# Patient Record
Sex: Female | Born: 1940 | Race: White | Hispanic: No | Smoking: Current every day smoker
Health system: Southern US, Community
[De-identification: ages and names within clinical notes are randomized; demographics above are authoritative.]

## PROBLEM LIST (undated history)

## (undated) DIAGNOSIS — J449 Chronic obstructive pulmonary disease, unspecified: Secondary | ICD-10-CM

## (undated) DIAGNOSIS — C801 Malignant (primary) neoplasm, unspecified: Secondary | ICD-10-CM

## (undated) DIAGNOSIS — J45909 Unspecified asthma, uncomplicated: Secondary | ICD-10-CM

## (undated) HISTORY — DX: Malignant (primary) neoplasm, unspecified: C80.1

## (undated) HISTORY — DX: Unspecified asthma, uncomplicated: J45.909

## (undated) HISTORY — DX: Chronic obstructive pulmonary disease, unspecified: J44.9

---

## 2006-04-21 HISTORY — PX: COLON SURGERY: SHX602

## 2006-09-18 ENCOUNTER — Ambulatory Visit: Payer: Self-pay | Admitting: Internal Medicine

## 2006-09-19 ENCOUNTER — Other Ambulatory Visit: Payer: Self-pay

## 2006-09-19 ENCOUNTER — Inpatient Hospital Stay: Payer: Self-pay | Admitting: Vascular Surgery

## 2006-09-20 ENCOUNTER — Ambulatory Visit: Payer: Self-pay | Admitting: Internal Medicine

## 2006-10-20 ENCOUNTER — Ambulatory Visit: Payer: Self-pay | Admitting: Internal Medicine

## 2006-10-28 ENCOUNTER — Ambulatory Visit: Payer: Self-pay | Admitting: Internal Medicine

## 2006-11-04 ENCOUNTER — Ambulatory Visit: Payer: Self-pay | Admitting: Vascular Surgery

## 2006-11-20 ENCOUNTER — Ambulatory Visit: Payer: Self-pay | Admitting: Internal Medicine

## 2006-12-21 ENCOUNTER — Ambulatory Visit: Payer: Self-pay | Admitting: Internal Medicine

## 2007-01-20 ENCOUNTER — Ambulatory Visit: Payer: Self-pay | Admitting: Internal Medicine

## 2007-02-20 ENCOUNTER — Ambulatory Visit: Payer: Self-pay | Admitting: Internal Medicine

## 2007-03-22 ENCOUNTER — Ambulatory Visit: Payer: Self-pay | Admitting: Internal Medicine

## 2007-04-22 ENCOUNTER — Ambulatory Visit: Payer: Self-pay | Admitting: Internal Medicine

## 2007-05-23 ENCOUNTER — Ambulatory Visit: Payer: Self-pay | Admitting: Internal Medicine

## 2007-06-20 ENCOUNTER — Ambulatory Visit: Payer: Self-pay | Admitting: Internal Medicine

## 2007-07-21 ENCOUNTER — Ambulatory Visit: Payer: Self-pay | Admitting: Internal Medicine

## 2007-08-20 ENCOUNTER — Ambulatory Visit: Payer: Self-pay | Admitting: Internal Medicine

## 2007-08-30 ENCOUNTER — Ambulatory Visit: Payer: Self-pay | Admitting: Vascular Surgery

## 2007-09-20 ENCOUNTER — Ambulatory Visit: Payer: Self-pay | Admitting: Internal Medicine

## 2007-10-20 ENCOUNTER — Ambulatory Visit: Payer: Self-pay | Admitting: Internal Medicine

## 2007-11-20 ENCOUNTER — Ambulatory Visit: Payer: Self-pay | Admitting: Internal Medicine

## 2007-12-21 ENCOUNTER — Ambulatory Visit: Payer: Self-pay | Admitting: Internal Medicine

## 2010-01-19 ENCOUNTER — Ambulatory Visit: Payer: Self-pay | Admitting: Internal Medicine

## 2010-01-29 ENCOUNTER — Ambulatory Visit: Payer: Self-pay | Admitting: Internal Medicine

## 2010-02-07 ENCOUNTER — Ambulatory Visit: Payer: Self-pay | Admitting: Internal Medicine

## 2010-02-19 ENCOUNTER — Ambulatory Visit: Payer: Self-pay | Admitting: Internal Medicine

## 2012-04-20 IMAGING — CT CT CHEST-ABD-PELV W/ CM
1 of 2 series · 13 of 32 positions shown, 18 images · non-contrast
Comparison: none

REASON FOR EXAM: Restaging Colorectal CA
COMMENTS:

[Series 2: ch-ab-pel w 5.0 i40f · axial · 0.74mm/px · z∈[-1026,-486]mm · 13 of 120 slices shown, 18 images]
[im 6/120  soft-tissue]
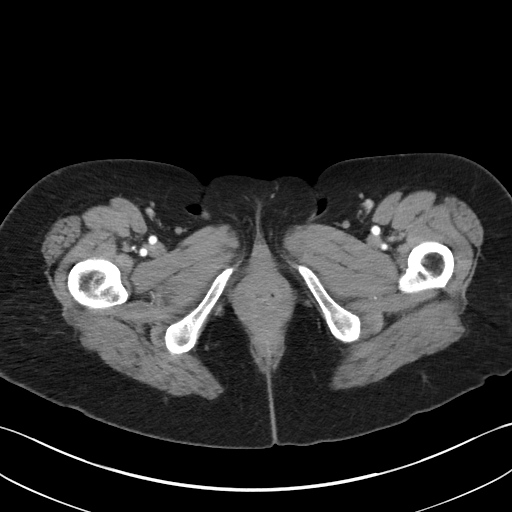
[im 6/120  bone]
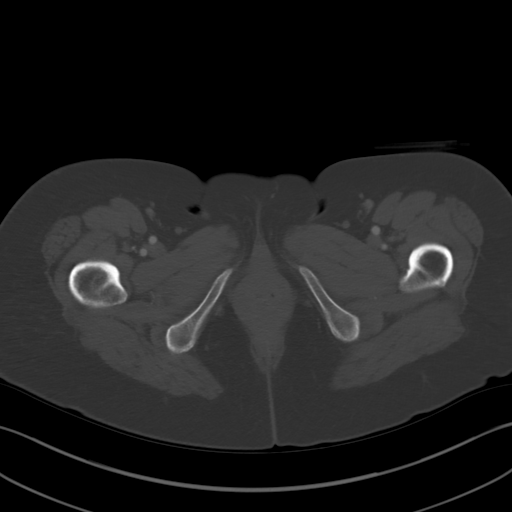
[im 18/120  soft-tissue]
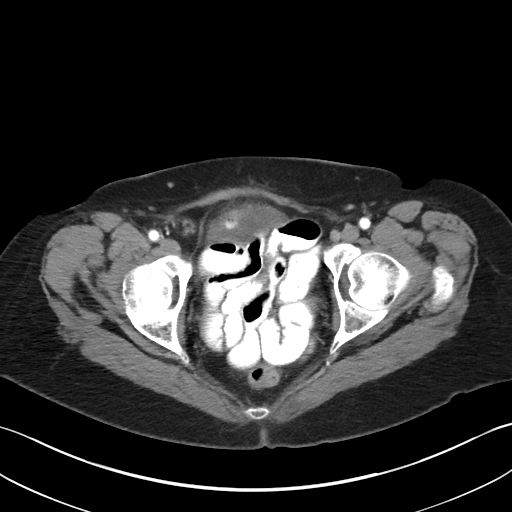
[im 24/120  soft-tissue]
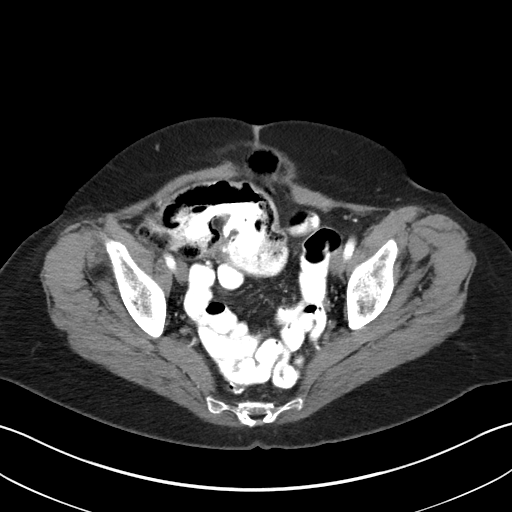
[im 36/120  soft-tissue]
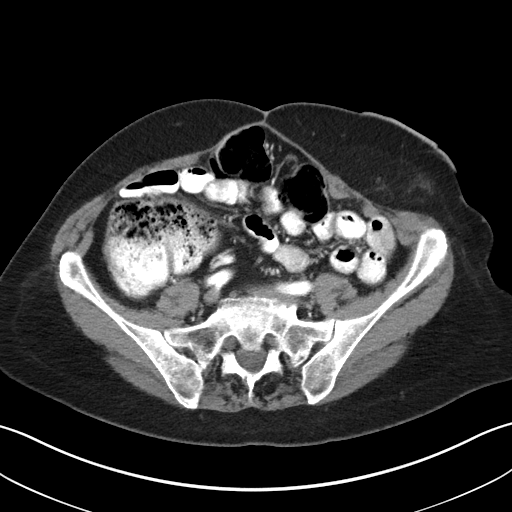
[im 48/120  soft-tissue]
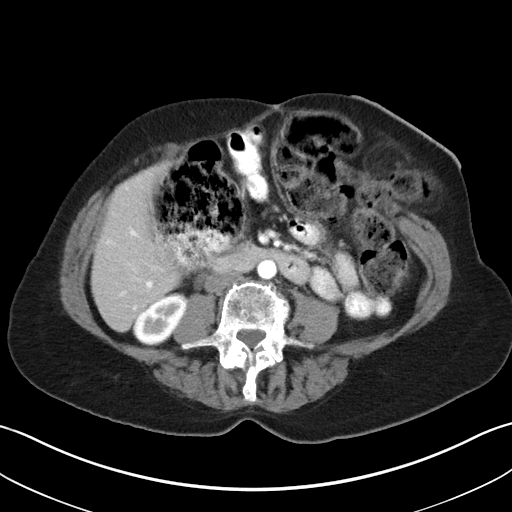
[im 54/120  soft-tissue]
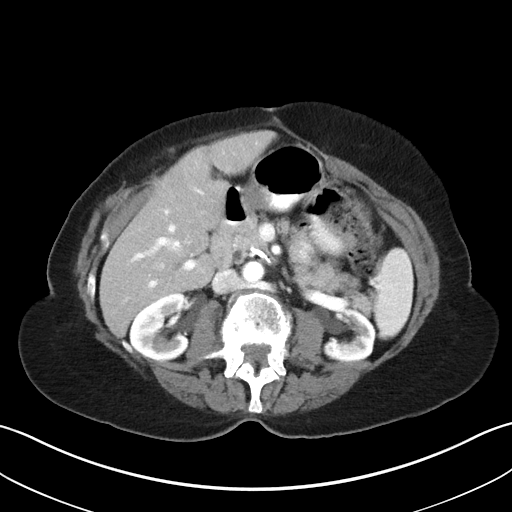
[im 66/120  soft-tissue]
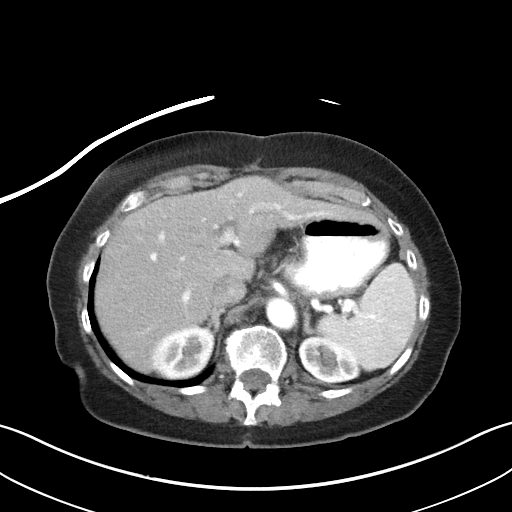
[im 72/120  soft-tissue]
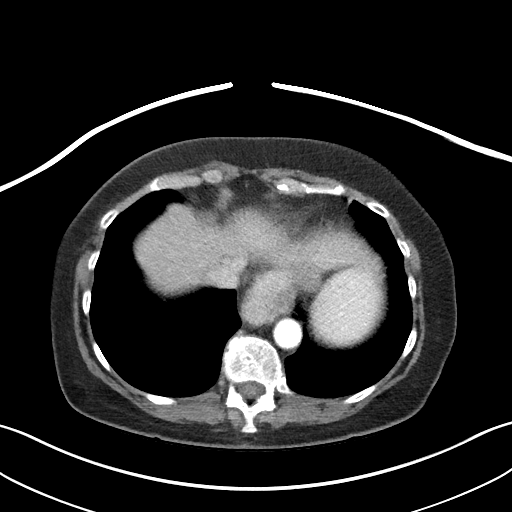
[im 84/120  soft-tissue]
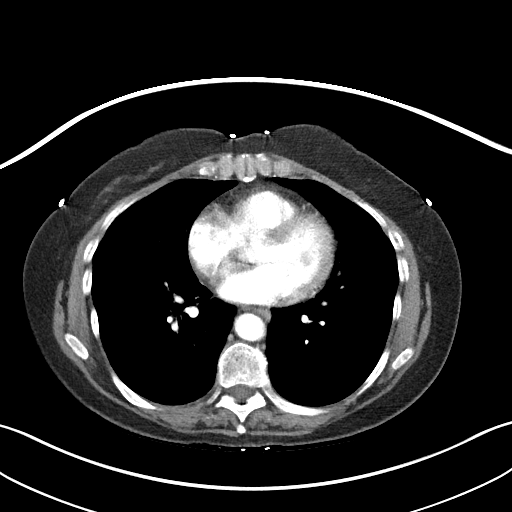
[im 84/120  bone]
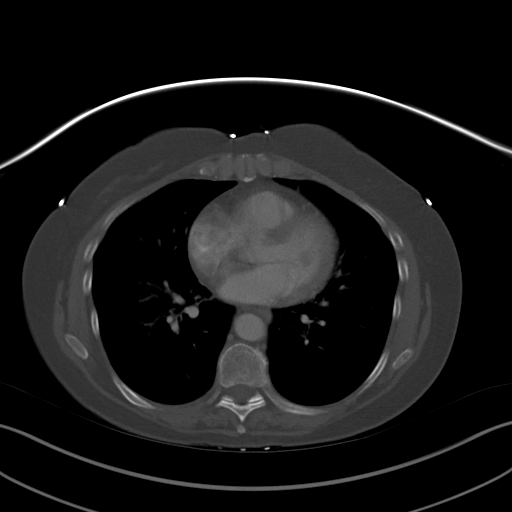
[im 96/120  soft-tissue]
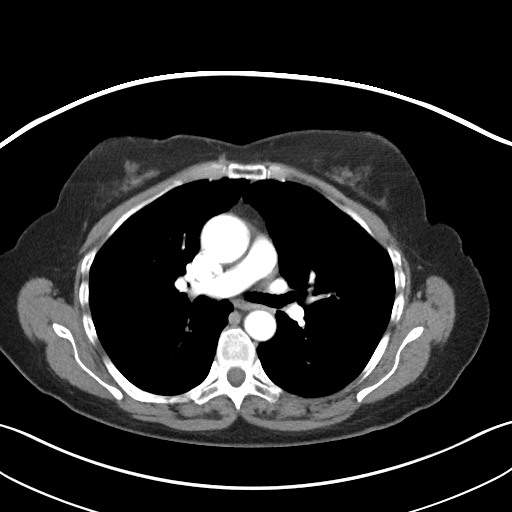
[im 96/120  lung]
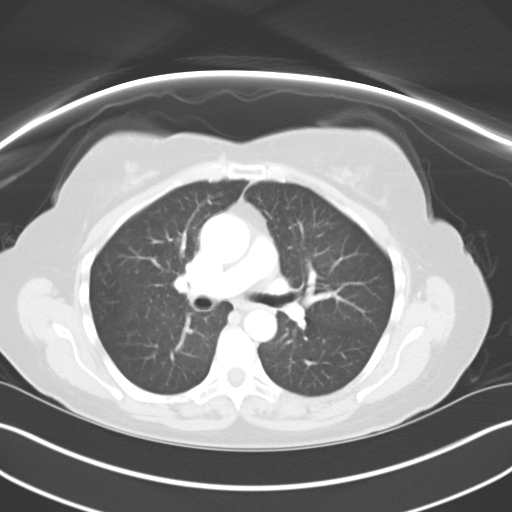
[im 102/120  soft-tissue]
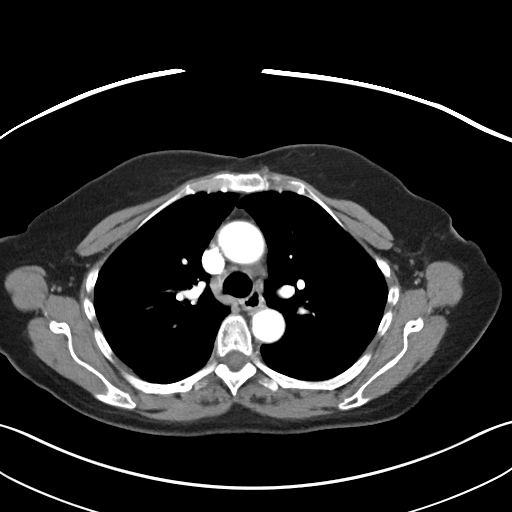
[im 102/120  lung]
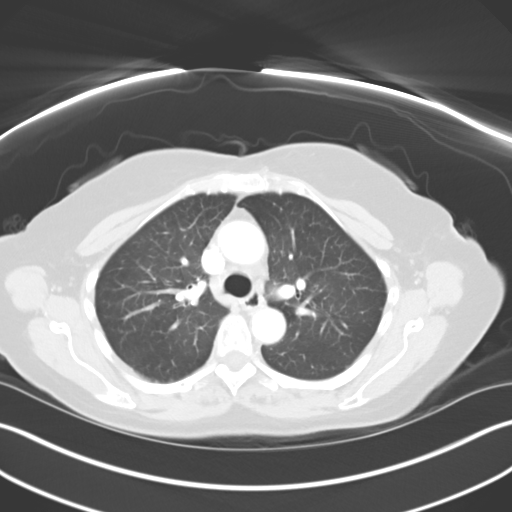
[im 108/120  lung]
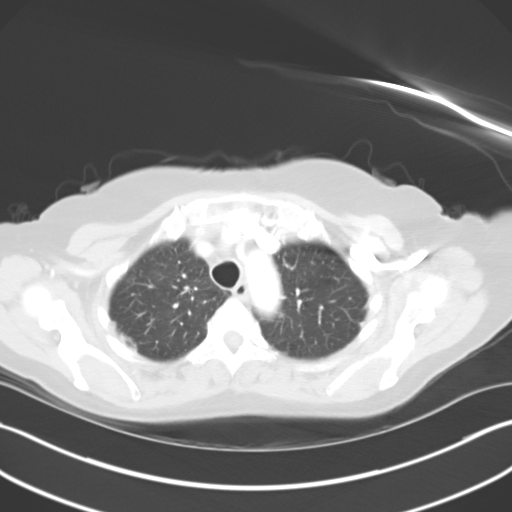
[im 114/120  soft-tissue]
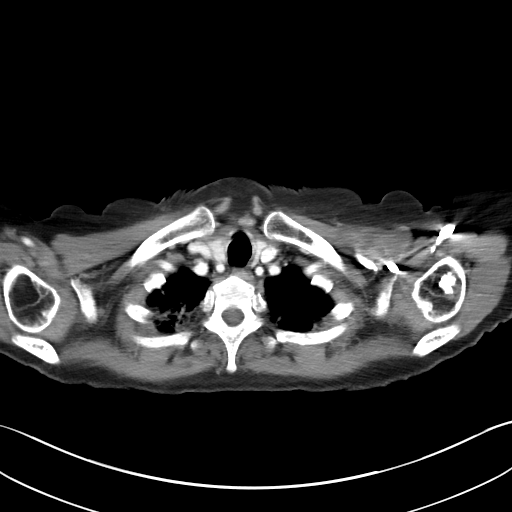
[im 114/120  lung]
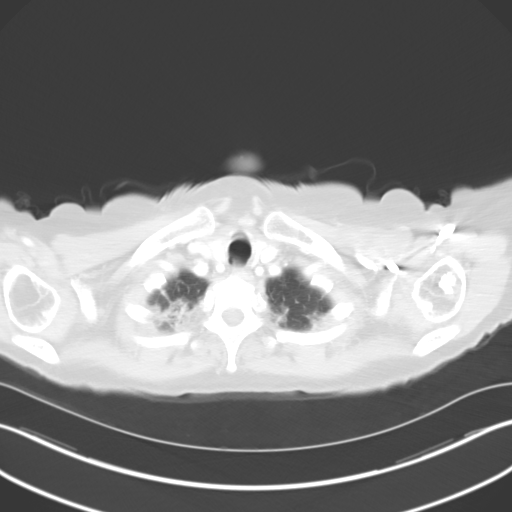

[13 of 32 positions shown; findings below may reference images not displayed]

PROCEDURE:     KCT - KCT CHEST ABDOMEN AND PELVIS W  - February 11, 2010  [DATE]

RESULT:     CT of the chest, abdomen and pelvis is performed utilizing 85 ml
of Rsovue-6F5 iodinated intravenous contrast along with oral contrast.
Images are reconstructed in the axial plane at 5 mm slice thickness and
compared to the previous study dated 08/24/2007.

There is a history of partial colectomy with an ostomy in the left mid
abdominal region. There is reported history of appendectomy and
cholecystectomy as well as hysterectomy. There is a paraumbilical ventral
hernia containing loops of small bowel without wall thickening or abnormal
bowel distention demonstrated between images #77 and #84. A second area of
bowel herniation is seen to the left of midline again containing no evidence
of abnormality such as abnormal distention or wall thickening demonstrated
between images #70 and #80. This appears to be transverse colon in this
hernia. The hernias appear to be new compared to the previous study from
4445. The rectum remains in place. There is a moderately large amount of
fecal material within the cecum and hepatic flexure regions. There is no
definite bowel wall thickening or evidence of obstruction. No hepatic masses
are evident. The spleen is unremarkable. The left kidney is slightly smaller
than the right. There is a moderate sized hiatal hernia. The adrenal glands
appear to be unremarkable. The aorta is normal in caliber. The urinary
bladder is within normal limits. There is no adnexal mass or pelvic mass
appreciated. No retroperitoneal or mesenteric adenopathy is evident. The
kidneys demonstrate no obstruction, cyst or solid mass.

Within the chest there is no mediastinal or hilar mass or adenopathy. There
is no supraclavicular or axillary mass or adenopathy. The included thyroid
lobes appear to enhance homogeneously and show no evidence of enlargement.
The thoracic aorta is normal in caliber. The heart is normal in size. There
is no pleural or pericardial effusion. There does appear to be apical
fibrosis present bilaterally. There is no evidence of a pulmonary
parenchymal mass, pleural effusion or pericardial effusion. Fibrotic changes
in the lungs appear to be stable.
IMPRESSION: 1. No evidence of residual or recurrent malignancy.
2. Colostomy in the left midabdomen.
3. Paraumbilical hernias to the right and left of midline as described.
4. Apical fibrosis in both lungs.
5. Not mentioned above is degenerative disc narrowing especially at L5-S1
and L4-L5 and also at L2-L3 where there is degenerative endplate sclerosis
inferiorly in L2 with some hypertrophic spurring. Multilevel facet
hypertrophy is present in the lumbar region.

## 2014-06-29 ENCOUNTER — Ambulatory Visit: Payer: Self-pay | Admitting: Internal Medicine

## 2015-05-29 ENCOUNTER — Other Ambulatory Visit: Payer: Self-pay | Admitting: *Deleted

## 2015-05-29 DIAGNOSIS — C189 Malignant neoplasm of colon, unspecified: Secondary | ICD-10-CM

## 2015-05-30 ENCOUNTER — Telehealth: Payer: Self-pay | Admitting: *Deleted

## 2015-05-30 NOTE — Telephone Encounter (Signed)
Per scheduling, pt refused appointment to see Dr. Oliva Bustard.

## 2017-06-25 ENCOUNTER — Other Ambulatory Visit: Payer: Self-pay | Admitting: Internal Medicine

## 2017-06-25 ENCOUNTER — Ambulatory Visit
Admission: RE | Admit: 2017-06-25 | Discharge: 2017-06-25 | Disposition: A | Discharge status: Home / Self Care | Payer: Medicare Other | Source: Ambulatory Visit | Attending: Internal Medicine | Admitting: Internal Medicine

## 2017-06-25 DIAGNOSIS — R059 Cough, unspecified: Secondary | ICD-10-CM

## 2017-06-25 DIAGNOSIS — J449 Chronic obstructive pulmonary disease, unspecified: Secondary | ICD-10-CM | POA: Diagnosis present

## 2017-06-25 DIAGNOSIS — R05 Cough: Secondary | ICD-10-CM

## 2019-09-02 IMAGING — DX DG CHEST 2V
2 series · 2 of 2 positions shown · non-contrast
Comparison: 06/29/2014

CLINICAL DATA: Cough

EXAM:
CHEST - 2 VIEW

[chest pa]
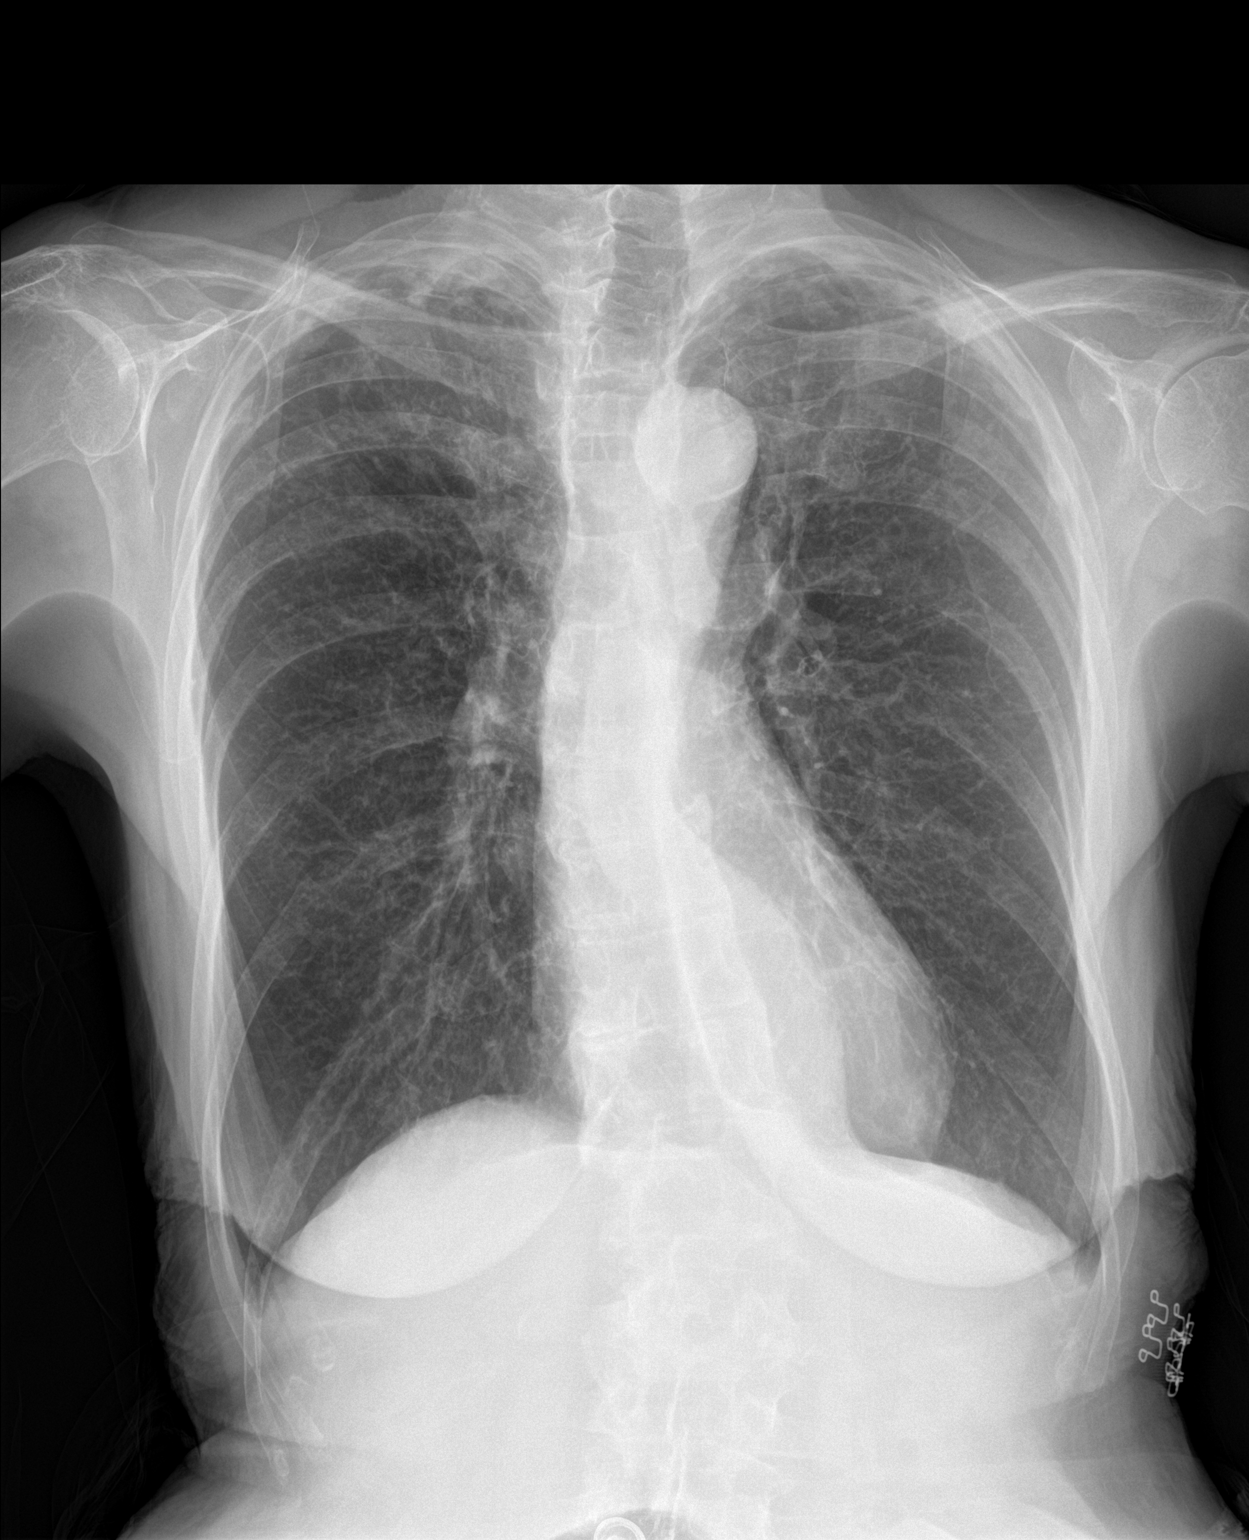

[chest lat]
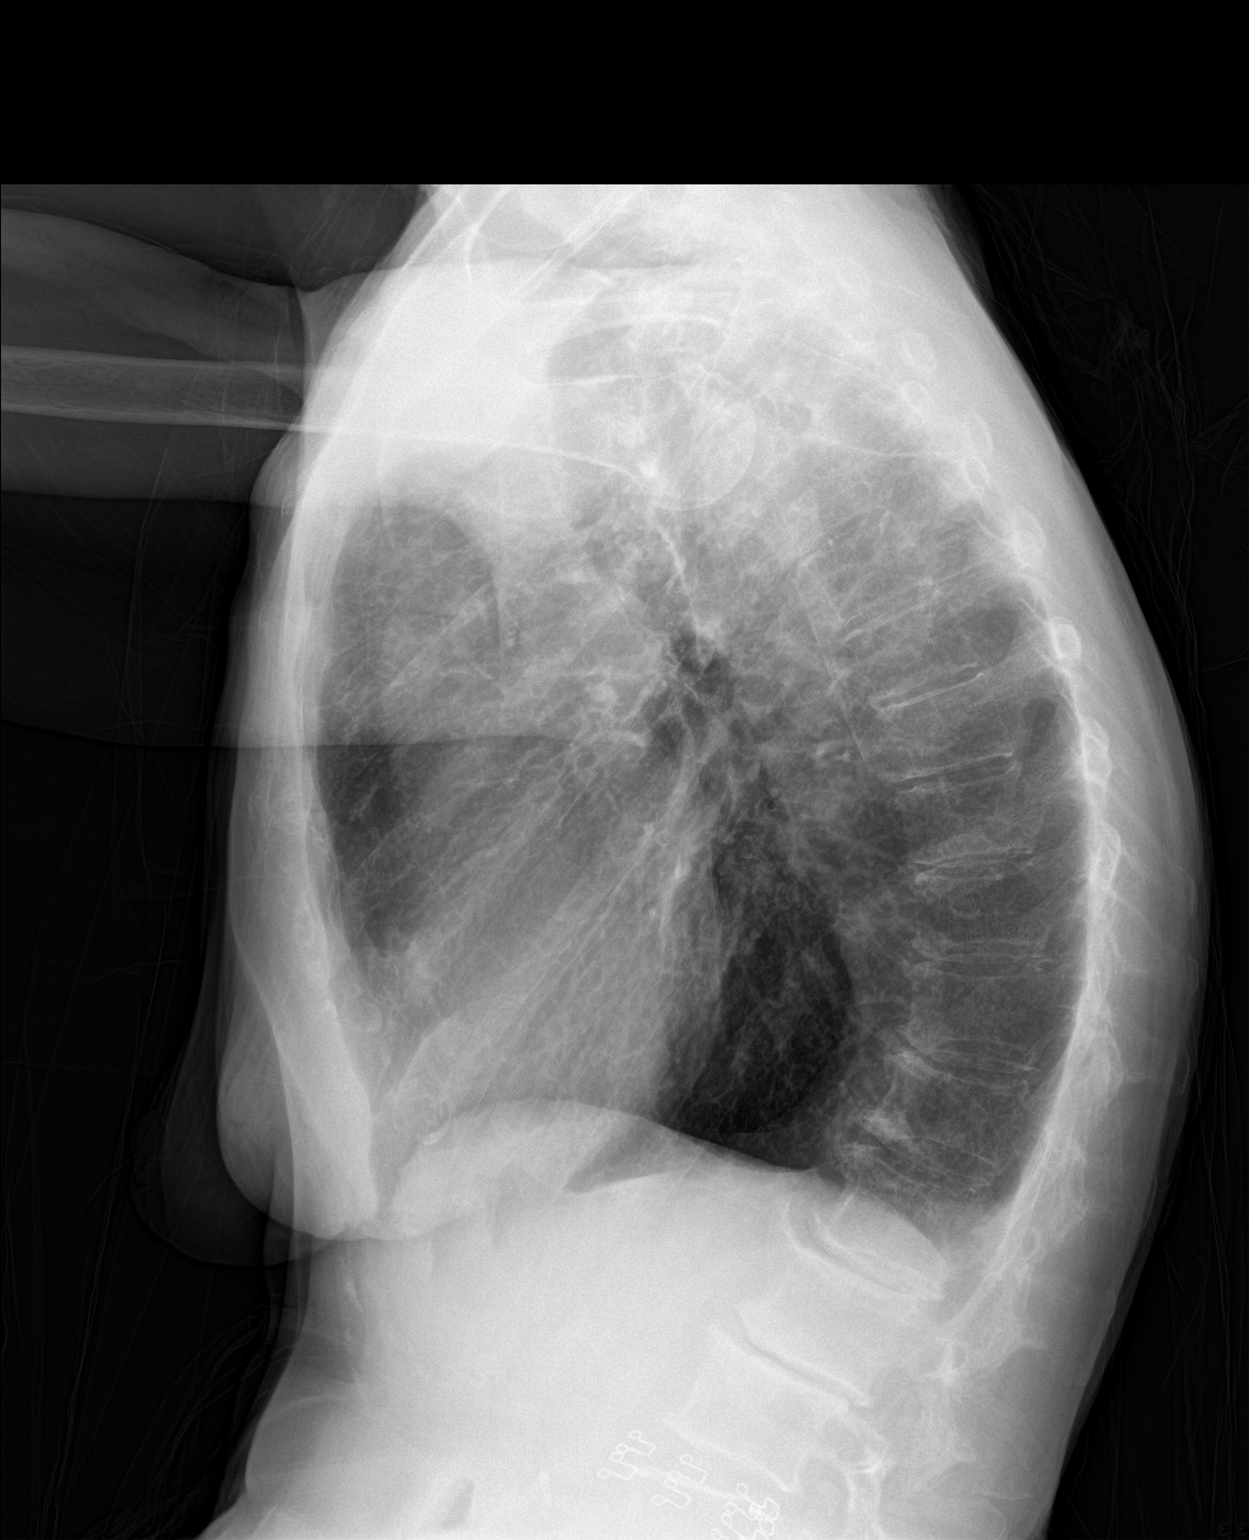

[2 of 2 positions shown; findings below may reference images not displayed]

FINDINGS: Cardiac shadow is within normal limits. Aortic calcifications are
again seen. Biapical pleuroparenchymal scarring is again seen and
stable. No focal infiltrate or sizable effusion is seen.
Hyperinflation is again noted consistent with COPD. No bony
abnormality is noted.
IMPRESSION: COPD and chronic changes without acute abnormality.

## 2022-09-26 ENCOUNTER — Encounter: Payer: Self-pay | Admitting: Physician Assistant

## 2022-09-26 ENCOUNTER — Ambulatory Visit (INDEPENDENT_AMBULATORY_CARE_PROVIDER_SITE_OTHER): Payer: 59 | Admitting: Physician Assistant

## 2022-09-26 VITALS — BP 122/82 | HR 82 | Temp 98.2°F | Ht 61.0 in | Wt 112.0 lb

## 2022-09-26 DIAGNOSIS — I700 Atherosclerosis of aorta: Secondary | ICD-10-CM | POA: Diagnosis not present

## 2022-09-26 DIAGNOSIS — Z85038 Personal history of other malignant neoplasm of large intestine: Secondary | ICD-10-CM

## 2022-09-26 DIAGNOSIS — J449 Chronic obstructive pulmonary disease, unspecified: Secondary | ICD-10-CM | POA: Diagnosis not present

## 2022-09-26 DIAGNOSIS — L821 Other seborrheic keratosis: Secondary | ICD-10-CM | POA: Diagnosis not present

## 2022-09-26 DIAGNOSIS — Z933 Colostomy status: Secondary | ICD-10-CM

## 2022-09-26 DIAGNOSIS — F17200 Nicotine dependence, unspecified, uncomplicated: Secondary | ICD-10-CM | POA: Diagnosis not present

## 2022-09-26 NOTE — Assessment & Plan Note (Signed)
Continue colostomy maintenance.

## 2022-09-26 NOTE — Assessment & Plan Note (Signed)
Many lesions including probable large SK of face. Encouraged second opinion with complete skin check by dermatologist. Referral initiated today.

## 2022-09-26 NOTE — Assessment & Plan Note (Signed)
Encouraged smoking cessation, patient uninterested. Continue current inhalers. Continue to monitor.

## 2022-09-26 NOTE — Progress Notes (Signed)
Date:  09/26/2022   Name:  Catherine Scott   DOB:  10-Jan-1941   MRN:  [id number]   Chief Complaint: Establish Care  HPI Catherine Scott is a pleasant 82 year old female with a history of COPD, distant CRC s/p colectomy with colostomy, and current smoker of 30+ pack-years new to the clinic today to establish care, joined by her daughter Catherine Scott.  Patient lives alone with her dog, completes all ADLs aside from driving and grocery shopping with which Catherine Scott assists. Previously seen by Dr. Dewaine Oats, MD q16m, though this provider no longer accepts Medicaid. Labs done about 1 month ago, Catherine Scott was told "the cancer cells are higher than usual," but to her knowledge there is no plan for intervention or referral - will need to review records to clarify. Patient does not have any specialists.  Feels her COPD is relatively well-controlled on current inhalers including Atrovent and Symbicort.  She has no interest in smoking cessation, plans to smoke until end-of-life. Takes Lexapro for "moodiness" with good effect. Does not need anything in particular from today's visit.    Medication list has been reviewed and updated.  Current Meds  Medication Sig   ATROVENT HFA 17 MCG/ACT inhaler Inhale 2 puffs into the lungs every 6 (six) hours as needed.   escitalopram (LEXAPRO) 10 MG tablet Take 10 mg by mouth daily.   gabapentin (NEURONTIN) 600 MG tablet Take 600 mg by mouth at bedtime.   SYMBICORT 160-4.5 MCG/ACT inhaler Inhale 2 puffs into the lungs 2 (two) times daily.     Review of Systems  Constitutional:  Negative for fatigue and fever.  Respiratory:  Negative for chest tightness and shortness of breath.   Cardiovascular:  Negative for chest pain and palpitations.  Gastrointestinal:  Negative for abdominal pain.    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  Past Surgical History:  Procedure Laterality Date   COLON SURGERY  2008    Social History   Tobacco Use   Smoking status: Every Day    Packs/day: 0.50    Years: 55.00    Additional pack years: 0.00    Total pack years: 27.50    Types: Cigarettes   Smokeless tobacco: Never  Vaping Use   Vaping Use: Never used  Substance Use Topics   Alcohol use: Not Currently   Drug use: Never    Family History  Problem Relation Age of Onset   Diabetes Mother    Cancer Brother         09/26/2022    3:20 PM  GAD 7 : Generalized Anxiety Score  Nervous, Anxious, on Edge 0  Control/stop worrying 0  Worry too much - different things 0  Trouble relaxing 0  Restless 0  Easily annoyed or irritable 0  Afraid - awful might happen 0  Total GAD 7 Score 0  Anxiety Difficulty Not difficult at all       09/26/2022    3:20 PM  Depression screen PHQ 2/9  Decreased Interest 0  Down, Depressed, Hopeless 0  PHQ - 2 Score 0  Altered sleeping 0  Tired, decreased energy 0  Change  in appetite 0  Feeling bad or failure about yourself  0  Trouble concentrating 0  Moving slowly or fidgety/restless 0  Suicidal thoughts 0  PHQ-9 Score 0  Difficult doing work/chores Not difficult at all    BP Readings from Last 3 Encounters:  09/26/22 122/82    Wt Readings from Last 3 Encounters:  09/26/22 112 lb (50.8 kg)    BP 122/82   Pulse 82   Temp 98.2 F (36.8 C) (Oral)   Ht 5\' 1"  (1.549 m)   Wt 112 lb (50.8 kg)   SpO2 94%   BMI 21.16 kg/m   Physical Exam Vitals and nursing note reviewed.  Constitutional:      Appearance: Normal appearance.  Cardiovascular:     Rate and Rhythm: Normal rate and regular rhythm.     Heart sounds: No murmur heard.    No friction rub. No gallop.  Pulmonary:      Effort: Pulmonary effort is normal.     Breath sounds: Normal breath sounds.  Skin:    Comments: Brief skin survey reveals numerous skin lesions including skin tags and seborrheic keratoses.  Lesion of interest on the left infraorbital face, brown hourglass-shaped plaque measuring roughly 20-25 mm and resembling SK - patient unsure if growing. There is a similar scaling lesion just superior to left lateral orbital rim     Recent Labs  No results found for: "NA", "K", "CL", "CO2", "GLUCOSE", "BUN", "CREATININE", "CALCIUM", "PROT", "ALBUMIN", "AST", "ALT", "ALKPHOS", "BILITOT", "GFRNONAA", "GFRAA"  No results found for: "WBC", "HGB", "HCT", "MCV", "PLT" No results found for: "HGBA1C" No results found for: "CHOL", "HDL", "LDLCALC", "LDLDIRECT", "TRIG", "CHOLHDL" No results found for: "TSH"   Assessment and Plan:  Aortic atherosclerosis (HCC) Encouraged smoking cessation, patient unwilling. Might consider statin at future visit but benefit may not be profound if initiating at age 1.   COPD (chronic obstructive pulmonary disease) (HCC) Encouraged smoking cessation, patient uninterested. Continue current inhalers. Continue to monitor.   Seborrheic keratoses Many lesions including probable large SK of face. Encouraged second opinion with complete skin check by dermatologist. Referral initiated today.   Tobacco use disorder Encouraged smoking cessation, patient uninterested. Continue current inhalers. Continue to monitor.   History of colon cancer Need to review records, in particular what "cancer cells" were elevated.   Colostomy status (HCC) Continue colostomy maintenance.    Return in about 6 months (around 03/28/2023) for OV f/u chronic conditions.   Partially dictated using Animal nutritionist. Any errors are unintentional.  Alvester Morin, PA-C, DMSc, Nutritionist [facility] and [facility] Medicine Med[facility] [phone]

## 2022-09-26 NOTE — Assessment & Plan Note (Signed)
Need to review records, in particular what "cancer cells" were elevated.

## 2022-09-26 NOTE — Assessment & Plan Note (Signed)
Encouraged smoking cessation, patient unwilling. Might consider statin at future visit but benefit may not be profound if initiating at age 81.

## 2022-09-26 NOTE — Patient Instructions (Signed)
-  It was a pleasure to see you today! Please review your visit summary for helpful information -I would encourage you to follow your care via MyChart where you can access lab results, notes, messages, and more -If you feel that we did a nice job today, please complete your after-visit survey and leave us a Google review! Your CMA today was Kieandra and your provider was Dan Hajer Dwyer, PA-C, DMSc -Please return for follow-up in about 6 months  

## 2022-11-04 ENCOUNTER — Telehealth: Payer: Self-pay | Admitting: Physician Assistant

## 2022-11-04 NOTE — Telephone Encounter (Signed)
Copied from CRM [id number]. Topic: Medicare AWV >> Nov 04, 2022 10:09 AM Payton Doughty wrote: Reason for CRM: Called 11/04/2022 to sched AWV - NO VOICEMAIL  Verlee Rossetti; Care Guide Ambulatory Clinical Support [place] [facility] Direct Dial: [phone]

## 2022-11-18 ENCOUNTER — Other Ambulatory Visit: Payer: Self-pay | Admitting: Physician Assistant

## 2022-11-19 NOTE — Telephone Encounter (Signed)
Please review.  KP

## 2022-11-19 NOTE — Telephone Encounter (Signed)
Requested medication (s) are due for refill today: Yes  Requested medication (s) are on the active medication list: Yes  Last refill:  09/26/22  Future visit scheduled: Yes  Notes to clinic:  Unable to refill per protocol, last refill by another provider.      Requested Prescriptions  Pending Prescriptions Disp Refills   gabapentin (NEURONTIN) 600 MG tablet [Pharmacy Med Name: GABAPENTIN 600 MG TABLET] 90 tablet 2    Sig: TAKE 1 TABLET BY MOUTH AT BEDTIME     Neurology: Anticonvulsants - gabapentin Failed - 11/18/2022  3:50 PM      Failed - Cr in normal range and within 360 days    No results found for: "CREATININE", "LABCREAU", "LABCREA", "POCCRE"       Passed - Completed PHQ-2 or PHQ-9 in the last 360 days      Passed - Valid encounter within last 12 months    Recent Outpatient Visits           1 month ago Chronic obstructive pulmonary disease, unspecified COPD type (HCC)   [place] [facility] at [facility], [place], [place]       Future Appointments             In 4 months Mordecai Maes, [place], PA [facility] Primary [facility] & [facility] Medicine at [facility], [facility]

## 2023-03-24 ENCOUNTER — Encounter: Payer: Self-pay | Admitting: Physician Assistant

## 2023-03-24 ENCOUNTER — Ambulatory Visit (INDEPENDENT_AMBULATORY_CARE_PROVIDER_SITE_OTHER): Payer: 59 | Admitting: Physician Assistant

## 2023-03-24 VITALS — BP 102/76 | HR 92 | Temp 99.3°F | Ht 61.0 in | Wt 123.0 lb

## 2023-03-24 DIAGNOSIS — Z23 Encounter for immunization: Secondary | ICD-10-CM

## 2023-03-24 DIAGNOSIS — L821 Other seborrheic keratosis: Secondary | ICD-10-CM

## 2023-03-24 DIAGNOSIS — I700 Atherosclerosis of aorta: Secondary | ICD-10-CM

## 2023-03-24 DIAGNOSIS — J449 Chronic obstructive pulmonary disease, unspecified: Secondary | ICD-10-CM

## 2023-03-24 MED ORDER — ATROVENT HFA 17 MCG/ACT IN AERS: 2.0000 | INHALATION_SPRAY | Freq: Four times a day (QID) | RESPIRATORY_TRACT | 11 refills | Status: AC | PRN

## 2023-03-24 MED ORDER — SYMBICORT 160-4.5 MCG/ACT IN AERO: 2.0000 | INHALATION_SPRAY | Freq: Two times a day (BID) | RESPIRATORY_TRACT | 11 refills | Status: DC

## 2023-03-24 MED ORDER — ESCITALOPRAM OXALATE 10 MG PO TABS: 10.0000 mg | ORAL_TABLET | Freq: Every day | ORAL | 2 refills | Status: DC

## 2023-03-24 MED ORDER — GABAPENTIN 300 MG PO CAPS: 300.0000 mg | ORAL_CAPSULE | Freq: Three times a day (TID) | ORAL | 3 refills | Status: DC

## 2023-03-24 NOTE — Patient Instructions (Signed)

## 2023-03-24 NOTE — Progress Notes (Signed)
Date:  03/24/2023   Name:  Catherine Scott   DOB:  1940/09/18   MRN:  [id number]   Chief Complaint: COPD  COPD Her past medical history is significant for COPD.   Catherine Scott returns with daughter Catherine Scott today for follow-up on chronic conditions.  Requesting refills on medications to get all prescriptions under my name.  No particular complaints, still smoking with no intention to quit.  COPD seems stable.  Has not seen dermatology about her skin lesions, which are seemingly large Sks.  Also due for flu shot, not sure if she has been vaccinated for pneumonia but thinks she has.  We have not yet received records from prior provider Dr. Arlana Pouch.   (PRIOR NOTE 09/26/22): Catherine Scott is a pleasant 82 year old female with a history of COPD, distant CRC s/p colectomy with colostomy, and current smoker of 30+ pack-years new to the clinic today to establish care, joined by her daughter Catherine Scott.  Patient lives alone with her dog, completes all ADLs aside from driving and grocery shopping with which Catherine Scott assists. Previously seen by Dr. Dewaine Oats, MD q62m, though this provider no longer accepts Medicaid. Labs done about 1 month ago, Catherine Scott was told "the cancer cells are higher than usual," but to her knowledge there is no plan for intervention or referral - will need to review records to clarify. Patient does not have any specialists.  Feels her COPD is relatively well-controlled on current inhalers including Atrovent and Symbicort.  She has no interest in smoking cessation, plans to smoke until end-of-life. Takes Lexapro for "moodiness" with good effect.     Medication list has been reviewed and updated.  Current Meds  Medication Sig   gabapentin (NEURONTIN) 300 MG capsule Take 1 capsule (300 mg total) by mouth 3 (three) times daily.   [DISCONTINUED] ATROVENT HFA 17 MCG/ACT inhaler Inhale 2 puffs into the lungs every 6 (six) hours as needed.   [DISCONTINUED] escitalopram (LEXAPRO) 10 MG tablet Take 10 mg by  mouth daily.   [DISCONTINUED] gabapentin (NEURONTIN) 600 MG tablet TAKE 1 TABLET BY MOUTH AT BEDTIME   [DISCONTINUED] SYMBICORT 160-4.5 MCG/ACT inhaler Inhale 2 puffs into the lungs 2 (two) times daily.     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   Seborrheic keratoses 09/26/2022    No Known Allergies  Immunization History  Administered Date(s) Administered   Fluad Trivalent(High Dose 65+) 03/24/2023    Past Surgical History:  Procedure Laterality Date   COLON SURGERY  2008    Social History   Tobacco Use   Smoking status: Every Day    Current packs/day: 0.50    Average packs/day: 0.5 packs/day for 55.0 years (27.5 ttl pk-yrs)    Types: Cigarettes   Smokeless tobacco: Never  Vaping Use   Vaping status: Never Used  Substance Use Topics   Alcohol use: Not Currently   Drug use: Never    Family History  Problem Relation Age of Onset   Diabetes Mother    Cancer Brother         03/24/2023    8:53 AM 09/26/2022    3:20 PM  GAD 7 : Generalized Anxiety Score  Nervous, Anxious, on Edge 0 0  Control/stop worrying 0 0  Worry too  much - different things 0 0  Trouble relaxing 0 0  Restless 0 0  Easily annoyed or irritable 0 0  Afraid - awful might happen 0 0  Total GAD 7 Score 0 0  Anxiety Difficulty Not difficult at all Not difficult at all       03/24/2023    8:52 AM 09/26/2022    3:20 PM  Depression screen PHQ 2/9  Decreased Interest 0 0  Down, Depressed, Hopeless 0 0  PHQ - 2 Score 0 0  Altered sleeping 0 0  Tired, decreased energy 2 0  Change in appetite 0 0  Feeling bad or failure about yourself  0 0  Trouble concentrating 0 0  Moving slowly or fidgety/restless 2 0  Suicidal thoughts 0 0  PHQ-9 Score 4 0   Difficult doing work/chores Not difficult at all Not difficult at all    BP Readings from Last 3 Encounters:  03/24/23 102/76  09/26/22 122/82    Wt Readings from Last 3 Encounters:  03/24/23 123 lb (55.8 kg)  09/26/22 112 lb (50.8 kg)    BP 102/76   Pulse 92   Temp 99.3 F (37.4 C) (Oral)   Ht 5\' 1"  (1.549 m)   Wt 123 lb (55.8 kg)   SpO2 97%   BMI 23.24 kg/m   Physical Exam Vitals and nursing note reviewed.  Constitutional:      Appearance: Normal appearance.  Neck:     Vascular: No carotid bruit.  Cardiovascular:     Rate and Rhythm: Normal rate and regular rhythm.     Heart sounds: No murmur heard.    No friction rub. No gallop.  Pulmonary:     Effort: Pulmonary effort is normal.     Breath sounds: Decreased air movement present. Decreased breath sounds, wheezing and rhonchi present. No rales.  Abdominal:     General: There is no distension.  Musculoskeletal:        General: Normal range of motion.  Skin:    General: Skin is warm and dry.     Comments: Brief skin survey reveals numerous skin lesions including skin tags and seborrheic keratoses.  Lesion of interest on the left infraorbital face, brown hourglass-shaped plaque measuring roughly 20-25 mm and resembling SK - patient unsure if growing. There is a similar scaling lesion just superior to left lateral orbital rim  Neurological:     Mental Status: She is alert and oriented to person, place, and time.     Gait: Gait is intact.  Psychiatric:        Mood and Affect: Mood and affect normal.     Recent Labs  No results found for: "NA", "K", "CL", "CO2", "GLUCOSE", "BUN", "CREATININE", "CALCIUM", "PROT", "ALBUMIN", "AST", "ALT", "ALKPHOS", "BILITOT", "GFRNONAA", "GFRAA"  No results found for: "WBC", "HGB", "HCT", "MCV", "PLT" No results found for: "HGBA1C" No results found for: "CHOL", "HDL", "LDLCALC", "LDLDIRECT", "TRIG", "CHOLHDL" No results found for: "TSH"   Assessment and Plan:  1. Chronic  obstructive pulmonary disease, unspecified COPD type (HCC) Encouraged smoking cessation, but she is not interested in quitting.  Due for baseline labs today.  Continue inhalers as originally prescribed.  All meds refilled today. - CBC with Differential/Platelet - Comprehensive metabolic panel - Lipid panel  2. Aortic atherosclerosis (HCC) Plan as above.  Statin would be recommended, but with her advanced age and continued tobacco use, the benefit is less clear.  Will obtain fasting lipids today to guide further decision making. -  CBC with Differential/Platelet - Comprehensive metabolic panel - Lipid panel  3. Need for influenza vaccination Flu shot given today - Flu Vaccine Trivalent High Dose (Fluad)  4. Seborrheic keratoses Encouraged to follow-up with dermatology as originally intended.  Number provided for [place] [facility] where she was referred.  [name]'s phone number has been corrected in our system.   F/u 50m OV chronic conditions.    Alvester Morin, PA-C, DMSc, Nutritionist [facility] Primary Care and Sports Medicine MedCenter [facility] [phone]

## 2023-03-25 LAB — LIPID PANEL
Chol/HDL Ratio: 2.5 {ratio} (ref 0.0–4.4)
Cholesterol, Total: 135 mg/dL (ref 100–199)
HDL: 55 mg/dL (ref >39)
LDL Chol Calc (NIH): 67 mg/dL (ref 0–99)
Triglycerides: 64 mg/dL (ref 0–149)
VLDL Cholesterol Cal: 13 mg/dL (ref 5–40)

## 2023-03-25 LAB — COMPREHENSIVE METABOLIC PANEL
ALT: 18 [IU]/L (ref 0–32)
AST: 28 [IU]/L (ref 0–40)
Albumin: 3.8 g/dL (ref 3.7–4.7)
Alkaline Phosphatase: 155 [IU]/L — ABNORMAL HIGH (ref 44–121)
BUN/Creatinine Ratio: 17 (ref 12–28)
BUN: 15 mg/dL (ref 8–27)
Bilirubin Total: 0.7 mg/dL (ref 0.0–1.2)
CO2: 24 mmol/L (ref 20–29)
Calcium: 9.1 mg/dL (ref 8.7–10.3)
Chloride: 101 mmol/L (ref 96–106)
Creatinine, Ser: 0.88 mg/dL (ref 0.57–1.00)
Globulin, Total: 2.8 g/dL (ref 1.5–4.5)
Glucose: 96 mg/dL (ref 70–99)
Potassium: 5 mmol/L (ref 3.5–5.2)
Sodium: 141 mmol/L (ref 134–144)
Total Protein: 6.6 g/dL (ref 6.0–8.5)
eGFR: 65 mL/min/{1.73_m2} (ref >59)

## 2023-03-25 LAB — CBC WITH DIFFERENTIAL/PLATELET
Basophils Absolute: 0.1 10*3/uL (ref 0.0–0.2)
Basos: 1 %
EOS (ABSOLUTE): 0.8 10*3/uL — ABNORMAL HIGH (ref 0.0–0.4)
Eos: 9 %
Hematocrit: 39.2 % (ref 34.0–46.6)
Hemoglobin: 12.8 g/dL (ref 11.1–15.9)
Immature Grans (Abs): 0 10*3/uL (ref 0.0–0.1)
Immature Granulocytes: 0 %
Lymphocytes Absolute: 1 10*3/uL (ref 0.7–3.1)
Lymphs: 12 %
MCH: 30.8 pg (ref 26.6–33.0)
MCHC: 32.7 g/dL (ref 31.5–35.7)
MCV: 94 fL (ref 79–97)
Monocytes Absolute: 0.7 10*3/uL (ref 0.1–0.9)
Monocytes: 7 %
Neutrophils Absolute: 6.3 10*3/uL (ref 1.4–7.0)
Neutrophils: 71 %
Platelets: 233 10*3/uL (ref 150–450)
RBC: 4.16 x10E6/uL (ref 3.77–5.28)
RDW: 11.3 % — ABNORMAL LOW (ref 11.7–15.4)
WBC: 8.8 10*3/uL (ref 3.4–10.8)

## 2023-08-21 ENCOUNTER — Other Ambulatory Visit: Payer: Self-pay | Admitting: Physician Assistant

## 2023-08-24 NOTE — Telephone Encounter (Signed)
 Dosage changed by provider on 03/24/23, will refuse this request.  Requested Prescriptions  Pending Prescriptions Disp Refills   gabapentin  (NEURONTIN ) 600 MG tablet [Pharmacy Med Name: GABAPENTIN  600 MG TABLET] 90 tablet 1    Sig: TAKE 1 TABLET BY MOUTH AT BEDTIME     There is no refill protocol information for this order

## 2023-09-16 ENCOUNTER — Ambulatory Visit (INDEPENDENT_AMBULATORY_CARE_PROVIDER_SITE_OTHER): Payer: Self-pay | Admitting: Physician Assistant

## 2023-09-16 ENCOUNTER — Encounter: Payer: Self-pay | Admitting: Physician Assistant

## 2023-09-16 VITALS — BP 110/80 | HR 80 | Temp 98.3°F | Ht 61.0 in | Wt 112.0 lb

## 2023-09-16 DIAGNOSIS — F3340 Major depressive disorder, recurrent, in remission, unspecified: Secondary | ICD-10-CM | POA: Insufficient documentation

## 2023-09-16 DIAGNOSIS — F17200 Nicotine dependence, unspecified, uncomplicated: Secondary | ICD-10-CM | POA: Diagnosis not present

## 2023-09-16 DIAGNOSIS — J449 Chronic obstructive pulmonary disease, unspecified: Secondary | ICD-10-CM

## 2023-09-16 DIAGNOSIS — Z23 Encounter for immunization: Secondary | ICD-10-CM

## 2023-09-16 DIAGNOSIS — L821 Other seborrheic keratosis: Secondary | ICD-10-CM

## 2023-09-16 DIAGNOSIS — I700 Atherosclerosis of aorta: Secondary | ICD-10-CM

## 2023-09-16 NOTE — Assessment & Plan Note (Signed)
 Reminded to schedule with dermatology for further evaluation and reassurance

## 2023-09-16 NOTE — Assessment & Plan Note (Signed)
 Graduated on smoking reduction, encouraged working toward complete cessation

## 2023-09-16 NOTE — Assessment & Plan Note (Signed)
 Keep same dose of escitalopram  for now.  Continue to monitor for persistent/worsening paranoia symptoms or  memory problems

## 2023-09-16 NOTE — Progress Notes (Signed)
 Date:  09/16/2023   Name:  Catherine Scott   DOB:  Dec 18, 1940   MRN:  [id number]   Chief Complaint: Medical Management of Chronic Issues (Wants increase in lexapro  )  HPI Catherine Scott returns with daughter Catherine Scott today for follow-up on chronic conditions.   No particular complaints, still smoking but has reduced some. COPD seems stable.  Has not seen dermatology about her skin lesions, which are seemingly large Sks, referred nearly a year ago to [facility] dermatology.   Catherine Scott voices some concerns that Catherine Scott may be experiencing some paranoia, as she complains of people shining lights at her trailer at night and says the bathroom window was a little open one time. Catherine Scott wonders if Lexapro  dose increase would be appropriate.   Medication list has been reviewed and updated.  Current Meds  Medication Sig   ATROVENT  HFA 17 MCG/ACT inhaler Inhale 2 puffs into the lungs every 6 (six) hours as needed.   escitalopram  (LEXAPRO ) 10 MG tablet Take 1 tablet (10 mg total) by mouth daily.   gabapentin  (NEURONTIN ) 300 MG capsule Take 1 capsule (300 mg total) by mouth 3 (three) times daily.   SYMBICORT  160-4.5 MCG/ACT inhaler Inhale 2 puffs into the lungs 2 (two) times daily.     Review of Systems  Patient Active Problem List   Diagnosis Date Noted   Recurrent major depressive disorder, in remission (HCC) 09/16/2023   Tobacco use disorder 09/26/2022   COPD (chronic obstructive pulmonary disease) (HCC) 09/26/2022   Aortic atherosclerosis (HCC) 09/26/2022   History of colon cancer 09/26/2022   Colostomy status (HCC) 09/26/2022   Seborrheic keratoses 09/26/2022    No Known Allergies  Immunization History  Administered Date(s) Administered   Fluad Trivalent(High Dose 65+) 03/24/2023   PNEUMOCOCCAL CONJUGATE-20 09/16/2023    Past Surgical History:  Procedure Laterality Date   COLON SURGERY  2008    Social History   Tobacco Use   Smoking status: Every Day    Current packs/day: 0.50     Average packs/day: 0.5 packs/day for 55.0 years (27.5 ttl pk-yrs)    Types: Cigarettes   Smokeless tobacco: Never  Vaping Use   Vaping status: Never Used  Substance Use Topics   Alcohol use: Not Currently   Drug use: Never    Family History  Problem Relation Age of Onset   Diabetes Mother    Cancer Brother         09/16/2023    9:49 AM 03/24/2023    8:53 AM 09/26/2022    3:20 PM  GAD 7 : Generalized Anxiety Score  Nervous, Anxious, on Edge 0 0 0  Control/stop worrying 0 0 0  Worry too much - different things 0 0 0  Trouble relaxing 0 0 0  Restless 0 0 0  Easily annoyed or irritable 1 0 0  Afraid - awful might happen 0 0 0  Total GAD 7 Score 1 0 0  Anxiety Difficulty Not difficult at all Not difficult at all Not difficult at all       09/16/2023    9:56 AM 09/16/2023    9:48 AM 03/24/2023    8:52 AM  Depression screen PHQ 2/9  Decreased Interest 1 1 0  Down, Depressed, Hopeless 0 0 0  PHQ - 2 Score 1 1 0  Altered sleeping 0  0  Tired, decreased energy 2  2  Change in appetite 2  0  Feeling bad or failure about yourself  0  0  Trouble concentrating 0  0  Moving slowly or fidgety/restless 0  2  Suicidal thoughts 0  0  PHQ-9 Score 5  4  Difficult doing work/chores Not difficult at all  Not difficult at all    BP Readings from Last 3 Encounters:  09/16/23 110/80  03/24/23 102/76  09/26/22 122/82    Wt Readings from Last 3 Encounters:  09/16/23 112 lb (50.8 kg)  03/24/23 123 lb (55.8 kg)  09/26/22 112 lb (50.8 kg)    BP 110/80   Pulse 80   Temp 98.3 F (36.8 C)   Ht 5\' 1"  (1.549 m)   Wt 112 lb (50.8 kg)   SpO2 96%   BMI 21.16 kg/m   Physical Exam Vitals and nursing note reviewed.  Constitutional:      Appearance: Normal appearance.  Neck:     Vascular: No carotid bruit.  Cardiovascular:     Rate and Rhythm: Normal rate and regular rhythm.     Heart sounds: No murmur heard.    No friction rub. No gallop.  Pulmonary:     Effort: Pulmonary effort  is normal.     Breath sounds: Decreased air movement present. Decreased breath sounds, wheezing and rhonchi present. No rales.  Abdominal:     General: There is no distension.  Musculoskeletal:        General: Normal range of motion.  Skin:    General: Skin is warm and dry.     Comments: Brief skin survey reveals numerous skin lesions including skin tags and seborrheic keratoses.  Lesion of interest on the left infraorbital face, brown hourglass-shaped plaque measuring roughly 20-25 mm and resembling SK - patient unsure if growing. There is a similar scaling lesion just superior to left lateral orbital rim  Neurological:     Mental Status: She is alert and oriented to person, place, and time.     Gait: Gait is intact.  Psychiatric:        Mood and Affect: Mood and affect normal.     Recent Labs     Component Value Date/Time   NA 141 03/24/2023 0937   K 5.0 03/24/2023 0937   CL 101 03/24/2023 0937   CO2 24 03/24/2023 0937   GLUCOSE 96 03/24/2023 0937   BUN 15 03/24/2023 0937   CREATININE 0.88 03/24/2023 0937   CALCIUM 9.1 03/24/2023 0937   PROT 6.6 03/24/2023 0937   ALBUMIN 3.8 03/24/2023 0937   AST 28 03/24/2023 0937   ALT 18 03/24/2023 0937   ALKPHOS 155 (H) 03/24/2023 0937   BILITOT 0.7 03/24/2023 0937    Lab Results  Component Value Date   WBC 8.8 03/24/2023   HGB 12.8 03/24/2023   HCT 39.2 03/24/2023   MCV 94 03/24/2023   PLT 233 03/24/2023   No results found for: "HGBA1C" Lab Results  Component Value Date   CHOL 135 03/24/2023   HDL 55 03/24/2023   LDLCALC 67 03/24/2023   TRIG 64 03/24/2023   CHOLHDL 2.5 03/24/2023   No results found for: "TSH"   Assessment and Plan:  Chronic obstructive pulmonary disease, unspecified COPD type (HCC) Assessment & Plan: Continue Symbicort  and Atrovent  as prescribed, refills available.  Reminded that the most effective treatment for COPD is smoking cessation.  Patient verbalized understanding.   Tobacco use  disorder Assessment & Plan: Graduated on smoking reduction, encouraged working toward complete cessation   Seborrheic keratoses Assessment & Plan: Reminded to schedule with dermatology for further evaluation and reassurance  Recurrent major depressive disorder, in remission [facility]) Assessment & Plan: Keep same dose of escitalopram  for now.  Continue to monitor for persistent/worsening paranoia symptoms or  memory problems   Aortic atherosclerosis (HCC) Assessment & Plan: Last LDL less than 70, and with advanced age the benefits of statin therapy are less clear.  Counseled on smoking cessation to reduce chance of MI/CVA and minimize progression of plaque   Encounter for immunization -     Pneumococcal conjugate vaccine 20-valent     Return in about 6 months (around 03/18/2024) for OV f/u chronic conditions.   Today's visit billed for provider time of 32 minutes including chart review, physical exam, discussion of multiple chronic conditions, patient counseling, and documentation.  Cody Das, PA-C, DMSc, Nutritionist [facility] [phone]

## 2023-09-16 NOTE — Assessment & Plan Note (Signed)
 Continue Symbicort  and Atrovent  as prescribed, refills available.  Reminded that the most effective treatment for COPD is smoking cessation.  Patient verbalized understanding.

## 2023-09-16 NOTE — Assessment & Plan Note (Signed)
 Last LDL less than 70, and with advanced age the benefits of statin therapy are less clear.  Counseled on smoking cessation to reduce chance of MI/CVA and minimize progression of plaque

## 2023-11-28 ENCOUNTER — Other Ambulatory Visit: Payer: Self-pay | Admitting: Physician Assistant

## 2023-12-01 NOTE — Telephone Encounter (Signed)
 Requested Prescriptions  Pending Prescriptions Disp Refills   gabapentin  (NEURONTIN ) 300 MG capsule [Pharmacy Med Name: GABAPENTIN  300 MG CAPSULE] 90 capsule 3    Sig: TAKE 1 CAPSULE BY MOUTH THREE TIMES A DAY     Neurology: Anticonvulsants - gabapentin  Passed - 12/01/2023  3:32 PM      Passed - Cr in normal range and within 360 days    Creatinine, Ser  Date Value Ref Range Status  03/24/2023 0.88 0.57 - 1.00 mg/dL Final         Passed - Completed PHQ-2 or PHQ-9 in the last 360 days      Passed - Valid encounter within last 12 months    Recent Outpatient Visits           2 months ago Chronic obstructive pulmonary disease, unspecified COPD type (HCC)   [place] [facility] at [facility], Toribio SQUIBB, [place]       Future Appointments             In 3 months Manya, Toribio SQUIBB, PA [facility] at [facility], [facility]

## 2024-01-10 ENCOUNTER — Other Ambulatory Visit: Payer: Self-pay | Admitting: Physician Assistant

## 2024-01-11 NOTE — Telephone Encounter (Signed)
 Requested Prescriptions  Pending Prescriptions Disp Refills   escitalopram  (LEXAPRO ) 10 MG tablet [Pharmacy Med Name: ESCITALOPRAM  10 MG TABLET] 90 tablet 0    Sig: TAKE 1 TABLET BY MOUTH EVERY DAY     Psychiatry:  Antidepressants - SSRI Passed - 01/11/2024  3:10 PM      Passed - Completed PHQ-2 or PHQ-9 in the last 360 days      Passed - Valid encounter within last 6 months    Recent Outpatient Visits           3 months ago Chronic obstructive pulmonary disease, unspecified COPD type (HCC)   [place] [facility] at [facility], Toribio SQUIBB, [place]       Future Appointments             In 2 months Manya, Toribio SQUIBB, PA [facility] at [facility], [phone] [place]

## 2024-02-05 ENCOUNTER — Inpatient Hospital Stay: Admit: 2024-02-05

## 2024-02-05 ENCOUNTER — Emergency Department

## 2024-02-05 ENCOUNTER — Inpatient Hospital Stay

## 2024-02-05 ENCOUNTER — Other Ambulatory Visit: Payer: Self-pay

## 2024-02-05 ENCOUNTER — Encounter
Admission: EM | Disposition: A | Discharge status: Skilled Nursing Facility | Payer: Self-pay | Source: Home / Self Care | Attending: Internal Medicine

## 2024-02-05 ENCOUNTER — Inpatient Hospital Stay: Admit: 2024-02-05 | Discharge: 2024-02-05 | Disposition: A | Attending: Internal Medicine

## 2024-02-05 ENCOUNTER — Inpatient Hospital Stay
Admission: EM | Admit: 2024-02-05 | Discharge: 2024-02-10 | DRG: 481 | Disposition: A | Discharge status: Skilled Nursing Facility | Attending: Internal Medicine | Admitting: Internal Medicine

## 2024-02-05 DIAGNOSIS — Z01810 Encounter for preprocedural cardiovascular examination: Secondary | ICD-10-CM

## 2024-02-05 DIAGNOSIS — Z6821 Body mass index (BMI) 21.0-21.9, adult: Secondary | ICD-10-CM

## 2024-02-05 DIAGNOSIS — W010XXA Fall on same level from slipping, tripping and stumbling without subsequent striking against object, initial encounter: Secondary | ICD-10-CM | POA: Diagnosis present

## 2024-02-05 DIAGNOSIS — Z751 Person awaiting admission to adequate facility elsewhere: Secondary | ICD-10-CM | POA: Diagnosis not present

## 2024-02-05 DIAGNOSIS — Z7982 Long term (current) use of aspirin: Secondary | ICD-10-CM

## 2024-02-05 DIAGNOSIS — F32A Depression, unspecified: Secondary | ICD-10-CM | POA: Diagnosis present

## 2024-02-05 DIAGNOSIS — Z85038 Personal history of other malignant neoplasm of large intestine: Secondary | ICD-10-CM

## 2024-02-05 DIAGNOSIS — Z833 Family history of diabetes mellitus: Secondary | ICD-10-CM

## 2024-02-05 DIAGNOSIS — Z8709 Personal history of other diseases of the respiratory system: Secondary | ICD-10-CM | POA: Diagnosis not present

## 2024-02-05 DIAGNOSIS — Z7951 Long term (current) use of inhaled steroids: Secondary | ICD-10-CM | POA: Diagnosis not present

## 2024-02-05 DIAGNOSIS — S72001A Fracture of unspecified part of neck of right femur, initial encounter for closed fracture: Secondary | ICD-10-CM

## 2024-02-05 DIAGNOSIS — E440 Moderate protein-calorie malnutrition: Secondary | ICD-10-CM | POA: Diagnosis present

## 2024-02-05 DIAGNOSIS — R918 Other nonspecific abnormal finding of lung field: Secondary | ICD-10-CM | POA: Diagnosis not present

## 2024-02-05 DIAGNOSIS — Z79899 Other long term (current) drug therapy: Secondary | ICD-10-CM

## 2024-02-05 DIAGNOSIS — Z23 Encounter for immunization: Secondary | ICD-10-CM | POA: Diagnosis present

## 2024-02-05 DIAGNOSIS — L603 Nail dystrophy: Secondary | ICD-10-CM | POA: Diagnosis present

## 2024-02-05 DIAGNOSIS — W19XXXA Unspecified fall, initial encounter: Principal | ICD-10-CM

## 2024-02-05 DIAGNOSIS — D62 Acute posthemorrhagic anemia: Secondary | ICD-10-CM | POA: Diagnosis not present

## 2024-02-05 DIAGNOSIS — J4489 Other specified chronic obstructive pulmonary disease: Secondary | ICD-10-CM | POA: Diagnosis present

## 2024-02-05 DIAGNOSIS — F17210 Nicotine dependence, cigarettes, uncomplicated: Secondary | ICD-10-CM | POA: Diagnosis present

## 2024-02-05 DIAGNOSIS — S72009A Fracture of unspecified part of neck of unspecified femur, initial encounter for closed fracture: Secondary | ICD-10-CM | POA: Diagnosis present

## 2024-02-05 DIAGNOSIS — B351 Tinea unguium: Secondary | ICD-10-CM | POA: Diagnosis present

## 2024-02-05 DIAGNOSIS — Y92019 Unspecified place in single-family (private) house as the place of occurrence of the external cause: Secondary | ICD-10-CM | POA: Diagnosis not present

## 2024-02-05 DIAGNOSIS — Z9049 Acquired absence of other specified parts of digestive tract: Secondary | ICD-10-CM | POA: Diagnosis not present

## 2024-02-05 DIAGNOSIS — S72141A Displaced intertrochanteric fracture of right femur, initial encounter for closed fracture: Secondary | ICD-10-CM | POA: Diagnosis present

## 2024-02-05 DIAGNOSIS — E46 Unspecified protein-calorie malnutrition: Secondary | ICD-10-CM

## 2024-02-05 DIAGNOSIS — E441 Mild protein-calorie malnutrition: Secondary | ICD-10-CM | POA: Diagnosis not present

## 2024-02-05 LAB — CBC WITH DIFFERENTIAL/PLATELET
Abs Immature Granulocytes: 0.07 K/uL (ref 0.00–0.07)
Basophils Absolute: 0 K/uL (ref 0.0–0.1)
Basophils Relative: 0 %
Eosinophils Absolute: 0 K/uL (ref 0.0–0.5)
Eosinophils Relative: 0 %
HCT: 38.8 % (ref 36.0–46.0)
Hemoglobin: 12.9 g/dL (ref 12.0–15.0)
Immature Granulocytes: 1 %
Lymphocytes Relative: 4 %
Lymphs Abs: 0.5 K/uL — ABNORMAL LOW (ref 0.7–4.0)
MCH: 30.9 pg (ref 26.0–34.0)
MCHC: 33.2 g/dL (ref 30.0–36.0)
MCV: 93 fL (ref 80.0–100.0)
Monocytes Absolute: 0.6 K/uL (ref 0.1–1.0)
Monocytes Relative: 5 %
Neutro Abs: 11.4 K/uL — ABNORMAL HIGH (ref 1.7–7.7)
Neutrophils Relative %: 90 %
Platelets: 241 K/uL (ref 150–400)
RBC: 4.17 MIL/uL (ref 3.87–5.11)
RDW: 13 % (ref 11.5–15.5)
WBC: 12.6 K/uL — ABNORMAL HIGH (ref 4.0–10.5)
nRBC: 0 % (ref 0.0–0.2)

## 2024-02-05 LAB — COMPREHENSIVE METABOLIC PANEL WITH GFR
ALT: 22 U/L (ref 0–44)
AST: 38 U/L (ref 15–41)
Albumin: 3.9 g/dL (ref 3.5–5.0)
Alkaline Phosphatase: 132 U/L — ABNORMAL HIGH (ref 38–126)
Anion gap: 17 — ABNORMAL HIGH (ref 5–15)
BUN: 20 mg/dL (ref 8–23)
CO2: 23 mmol/L (ref 22–32)
Calcium: 9.2 mg/dL (ref 8.9–10.3)
Chloride: 94 mmol/L — ABNORMAL LOW (ref 98–111)
Creatinine, Ser: 0.49 mg/dL (ref 0.44–1.00)
GFR, Estimated: >60 mL/min (ref >60)
Glucose, Bld: 147 mg/dL — ABNORMAL HIGH (ref 70–99)
Potassium: 4.1 mmol/L (ref 3.5–5.1)
Sodium: 134 mmol/L — ABNORMAL LOW (ref 135–145)
Total Bilirubin: 1.2 mg/dL (ref 0.0–1.2)
Total Protein: 7.8 g/dL (ref 6.5–8.1)

## 2024-02-05 LAB — PROTIME-INR
INR: 1 (ref 0.8–1.2)
Prothrombin Time: 13.8 s (ref 11.4–15.2)

## 2024-02-05 LAB — ECHOCARDIOGRAM COMPLETE
AR max vel: 3.27 cm2
AV Area VTI: 3.89 cm2
AV Area mean vel: 2.83 cm2
AV Mean grad: 3 mmHg
AV Peak grad: 6 mmHg
Ao pk vel: 1.22 m/s
Area-P 1/2: 3.28 cm2
MV VTI: 3.13 cm2
S' Lateral: 2.25 cm
Weight: 1834.23 [oz_av]

## 2024-02-05 LAB — URINALYSIS, ROUTINE W REFLEX MICROSCOPIC
Bacteria, UA: NONE SEEN
Bilirubin Urine: NEGATIVE
Glucose, UA: >=500 mg/dL — AB
Hgb urine dipstick: NEGATIVE
Ketones, ur: NEGATIVE mg/dL
Leukocytes,Ua: NEGATIVE
Nitrite: NEGATIVE
Protein, ur: NEGATIVE mg/dL
Specific Gravity, Urine: 1.011 (ref 1.005–1.030)
pH: 6 (ref 5.0–8.0)

## 2024-02-05 LAB — TYPE AND SCREEN
ABO/RH(D): O POS
Antibody Screen: NEGATIVE

## 2024-02-05 LAB — CK: Total CK: 311 U/L — ABNORMAL HIGH (ref 38–234)

## 2024-02-05 LAB — VITAMIN D 25 HYDROXY (VIT D DEFICIENCY, FRACTURES): Vit D, 25-Hydroxy: 49.73 ng/mL (ref 30–100)

## 2024-02-05 SURGERY — FIXATION, FRACTURE, INTERTROCHANTERIC, WITH INTRAMEDULLARY ROD
Anesthesia: General | Site: Hip | Laterality: Right

## 2024-02-05 MED ORDER — PHENYLEPHRINE HCL-NACL 20-0.9 MG/250ML-% IV SOLN
INTRAVENOUS | Status: AC
  Filled 2024-02-05: qty 250

## 2024-02-05 MED ORDER — PROPOFOL 10 MG/ML IV BOLUS
INTRAVENOUS | Status: DC | PRN
  Administered 2024-02-05: 50 ug/kg/min via INTRAVENOUS
  Administered 2024-02-05: 100 mg via INTRAVENOUS

## 2024-02-05 MED ORDER — ACETAMINOPHEN 325 MG PO TABS
325.0000 mg | ORAL_TABLET | Freq: Four times a day (QID) | ORAL | Status: DC | PRN
  Administered 2024-02-07 – 2024-02-09 (×2): 650 mg via ORAL
  Filled 2024-02-05 (×3): qty 2

## 2024-02-05 MED ORDER — SENNOSIDES-DOCUSATE SODIUM 8.6-50 MG PO TABS: 1.0000 | ORAL_TABLET | Freq: Every evening | ORAL | Status: DC | PRN

## 2024-02-05 MED ORDER — ONDANSETRON HCL 4 MG PO TABS: 4.0000 mg | ORAL_TABLET | Freq: Four times a day (QID) | ORAL | Status: DC | PRN

## 2024-02-05 MED ORDER — GENTAMICIN SULFATE 40 MG/ML IJ SOLN
INTRAMUSCULAR | Status: DC | PRN
  Administered 2024-02-05: 80 mg

## 2024-02-05 MED ORDER — BUPIVACAINE-EPINEPHRINE (PF) 0.25% -1:200000 IJ SOLN
INTRAMUSCULAR | Status: AC
  Filled 2024-02-05: qty 60

## 2024-02-05 MED ORDER — SODIUM CHLORIDE 0.45 % IV SOLN: INTRAVENOUS | Status: DC

## 2024-02-05 MED ORDER — FLUTICASONE FUROATE-VILANTEROL 100-25 MCG/ACT IN AEPB
1.0000 | INHALATION_SPRAY | Freq: Every day | RESPIRATORY_TRACT | Status: DC
  Administered 2024-02-06 – 2024-02-10 (×5): 1 via RESPIRATORY_TRACT
  Filled 2024-02-05: qty 28

## 2024-02-05 MED ORDER — ENOXAPARIN SODIUM 40 MG/0.4ML IJ SOSY: 40.0000 mg | PREFILLED_SYRINGE | INTRAMUSCULAR | Status: DC

## 2024-02-05 MED ORDER — ESCITALOPRAM OXALATE 10 MG PO TABS
10.0000 mg | ORAL_TABLET | Freq: Every day | ORAL | Status: DC
  Administered 2024-02-06 – 2024-02-10 (×5): 10 mg via ORAL
  Filled 2024-02-05 (×5): qty 1

## 2024-02-05 MED ORDER — METOPROLOL TARTRATE 25 MG PO TABS
12.5000 mg | ORAL_TABLET | Freq: Two times a day (BID) | ORAL | Status: DC
  Administered 2024-02-05 – 2024-02-10 (×10): 12.5 mg via ORAL
  Filled 2024-02-05 (×10): qty 1

## 2024-02-05 MED ORDER — MAGNESIUM HYDROXIDE 400 MG/5ML PO SUSP: 30.0000 mL | Freq: Every day | ORAL | Status: DC | PRN

## 2024-02-05 MED ORDER — HYDROCODONE-ACETAMINOPHEN 5-325 MG PO TABS
1.0000 | ORAL_TABLET | ORAL | Status: DC | PRN
  Administered 2024-02-06: 1 via ORAL
  Administered 2024-02-07: 2 via ORAL
  Administered 2024-02-07: 1 via ORAL
  Administered 2024-02-07 – 2024-02-08 (×2): 2 via ORAL
  Administered 2024-02-08 – 2024-02-09 (×3): 1 via ORAL
  Administered 2024-02-10 (×2): 2 via ORAL
  Filled 2024-02-05: qty 1
  Filled 2024-02-05: qty 2
  Filled 2024-02-05 (×2): qty 1
  Filled 2024-02-05: qty 2
  Filled 2024-02-05 (×2): qty 1
  Filled 2024-02-05 (×3): qty 2

## 2024-02-05 MED ORDER — DEXAMETHASONE SOD PHOSPHATE PF 10 MG/ML IJ SOLN
INTRAMUSCULAR | Status: DC | PRN
  Administered 2024-02-05: 10 mg via INTRAVENOUS

## 2024-02-05 MED ORDER — CELECOXIB 200 MG PO CAPS
200.0000 mg | ORAL_CAPSULE | Freq: Two times a day (BID) | ORAL | Status: AC
  Administered 2024-02-05: 200 mg via ORAL
  Filled 2024-02-05: qty 1

## 2024-02-05 MED ORDER — MORPHINE SULFATE (PF) 2 MG/ML IV SOLN: 0.5000 mg | INTRAVENOUS | Status: DC | PRN

## 2024-02-05 MED ORDER — HYDROMORPHONE HCL 1 MG/ML IJ SOLN: 0.5000 mg | INTRAMUSCULAR | Status: DC | PRN

## 2024-02-05 MED ORDER — MORPHINE SULFATE (PF) 4 MG/ML IV SOLN
4.0000 mg | Freq: Once | INTRAVENOUS | Status: AC
  Administered 2024-02-05: 4 mg via INTRAVENOUS
  Filled 2024-02-05: qty 1

## 2024-02-05 MED ORDER — MORPHINE SULFATE (PF) 2 MG/ML IV SOLN
1.0000 mg | INTRAVENOUS | Status: DC | PRN
  Administered 2024-02-05: 1 mg via INTRAVENOUS
  Filled 2024-02-05: qty 1

## 2024-02-05 MED ORDER — ASPIRIN 81 MG PO TBEC: 81.0000 mg | DELAYED_RELEASE_TABLET | Freq: Every day | ORAL | Status: DC

## 2024-02-05 MED ORDER — MENTHOL 3 MG MT LOZG: 1.0000 | LOZENGE | OROMUCOSAL | Status: DC | PRN

## 2024-02-05 MED ORDER — BISACODYL 10 MG RE SUPP: 10.0000 mg | Freq: Every day | RECTAL | Status: DC | PRN

## 2024-02-05 MED ORDER — CEFAZOLIN SODIUM-DEXTROSE 2-4 GM/100ML-% IV SOLN
INTRAVENOUS | Status: AC
  Filled 2024-02-05: qty 100

## 2024-02-05 MED ORDER — FENTANYL CITRATE (PF) 100 MCG/2ML IJ SOLN
INTRAMUSCULAR | Status: AC
  Filled 2024-02-05: qty 2

## 2024-02-05 MED ORDER — LIDOCAINE HCL (CARDIAC) PF 100 MG/5ML IV SOSY
PREFILLED_SYRINGE | INTRAVENOUS | Status: DC | PRN
  Administered 2024-02-05: 100 mg via INTRAVENOUS

## 2024-02-05 MED ORDER — ADULT MULTIVITAMIN W/MINERALS CH
1.0000 | ORAL_TABLET | Freq: Every day | ORAL | Status: DC
  Administered 2024-02-06 – 2024-02-10 (×5): 1 via ORAL
  Filled 2024-02-05 (×5): qty 1

## 2024-02-05 MED ORDER — ACETAMINOPHEN 10 MG/ML IV SOLN
INTRAVENOUS | Status: DC | PRN
  Administered 2024-02-05: 1000 mg via INTRAVENOUS

## 2024-02-05 MED ORDER — GABAPENTIN 100 MG PO CAPS
200.0000 mg | ORAL_CAPSULE | Freq: Two times a day (BID) | ORAL | Status: DC
  Administered 2024-02-05 – 2024-02-10 (×10): 200 mg via ORAL
  Filled 2024-02-05 (×10): qty 2

## 2024-02-05 MED ORDER — ONDANSETRON HCL 4 MG/2ML IJ SOLN
INTRAMUSCULAR | Status: AC
  Filled 2024-02-05: qty 2

## 2024-02-05 MED ORDER — OXYCODONE HCL 5 MG PO TABS: 5.0000 mg | ORAL_TABLET | Freq: Once | ORAL | Status: DC | PRN

## 2024-02-05 MED ORDER — ENSURE PLUS HIGH PROTEIN PO LIQD
237.0000 mL | Freq: Two times a day (BID) | ORAL | Status: DC
  Administered 2024-02-06 – 2024-02-10 (×7): 237 mL via ORAL

## 2024-02-05 MED ORDER — TRANEXAMIC ACID-NACL 1000-0.7 MG/100ML-% IV SOLN
INTRAVENOUS | Status: AC
  Filled 2024-02-05: qty 100

## 2024-02-05 MED ORDER — LACTATED RINGERS IV BOLUS
1000.0000 mL | Freq: Once | INTRAVENOUS | Status: AC
  Administered 2024-02-05: 1000 mL via INTRAVENOUS

## 2024-02-05 MED ORDER — HYDROCODONE-ACETAMINOPHEN 7.5-325 MG PO TABS: 1.0000 | ORAL_TABLET | ORAL | Status: DC | PRN

## 2024-02-05 MED ORDER — SENNA 8.6 MG PO TABS
1.0000 | ORAL_TABLET | Freq: Two times a day (BID) | ORAL | Status: DC
  Administered 2024-02-05 – 2024-02-10 (×9): 8.6 mg via ORAL
  Filled 2024-02-05 (×9): qty 1

## 2024-02-05 MED ORDER — TRANEXAMIC ACID-NACL 1000-0.7 MG/100ML-% IV SOLN
1000.0000 mg | Freq: Once | INTRAVENOUS | Status: AC
  Administered 2024-02-05: 1000 mg via INTRAVENOUS

## 2024-02-05 MED ORDER — PHENYLEPHRINE 80 MCG/ML (10ML) SYRINGE FOR IV PUSH (FOR BLOOD PRESSURE SUPPORT)
PREFILLED_SYRINGE | INTRAVENOUS | Status: AC
  Filled 2024-02-05: qty 10

## 2024-02-05 MED ORDER — ACETAMINOPHEN 325 MG PO TABS: 650.0000 mg | ORAL_TABLET | Freq: Four times a day (QID) | ORAL | Status: DC | PRN

## 2024-02-05 MED ORDER — ONDANSETRON HCL 4 MG/2ML IJ SOLN: 4.0000 mg | Freq: Four times a day (QID) | INTRAMUSCULAR | Status: DC | PRN

## 2024-02-05 MED ORDER — ENOXAPARIN SODIUM 30 MG/0.3ML IJ SOSY
30.0000 mg | PREFILLED_SYRINGE | INTRAMUSCULAR | Status: DC
  Administered 2024-02-06 – 2024-02-07 (×2): 30 mg via SUBCUTANEOUS
  Filled 2024-02-05 (×2): qty 0.3

## 2024-02-05 MED ORDER — FLEET ENEMA RE ENEM: 1.0000 | ENEMA | Freq: Once | RECTAL | Status: DC | PRN

## 2024-02-05 MED ORDER — INFLUENZA VAC SPLIT HIGH-DOSE 0.5 ML IM SUSY
0.5000 mL | PREFILLED_SYRINGE | INTRAMUSCULAR | Status: AC
Start: 2024-02-06 — End: 2024-02-10
  Administered 2024-02-10: 0.5 mL via INTRAMUSCULAR
  Filled 2024-02-05 (×2): qty 0.5

## 2024-02-05 MED ORDER — FENTANYL CITRATE (PF) 100 MCG/2ML IJ SOLN: 25.0000 ug | INTRAMUSCULAR | Status: DC | PRN

## 2024-02-05 MED ORDER — CEFAZOLIN SODIUM-DEXTROSE 2-4 GM/100ML-% IV SOLN
2.0000 g | Freq: Three times a day (TID) | INTRAVENOUS | Status: AC
  Administered 2024-02-05 – 2024-02-06 (×3): 2 g via INTRAVENOUS
  Filled 2024-02-05 (×3): qty 100

## 2024-02-05 MED ORDER — ALUM & MAG HYDROXIDE-SIMETH 200-200-20 MG/5ML PO SUSP
30.0000 mL | ORAL | Status: DC | PRN
Start: 2024-02-05 — End: 2024-02-10

## 2024-02-05 MED ORDER — FENTANYL CITRATE (PF) 100 MCG/2ML IJ SOLN
INTRAMUSCULAR | Status: DC | PRN
  Administered 2024-02-05: 50 ug via INTRAVENOUS

## 2024-02-05 MED ORDER — SODIUM CHLORIDE 0.9 % IV SOLN: INTRAVENOUS | Status: DC

## 2024-02-05 MED ORDER — HYDROCODONE-ACETAMINOPHEN 5-325 MG PO TABS: 1.0000 | ORAL_TABLET | Freq: Four times a day (QID) | ORAL | Status: DC | PRN

## 2024-02-05 MED ORDER — OXYCODONE HCL 5 MG/5ML PO SOLN: 5.0000 mg | Freq: Once | ORAL | Status: DC | PRN

## 2024-02-05 MED ORDER — GABAPENTIN 300 MG PO CAPS: 300.0000 mg | ORAL_CAPSULE | Freq: Three times a day (TID) | ORAL | Status: DC

## 2024-02-05 MED ORDER — CHLORHEXIDINE GLUCONATE 4 % EX SOLN: Freq: Once | CUTANEOUS | Status: DC

## 2024-02-05 MED ORDER — SODIUM CHLORIDE 0.9 % IR SOLN
Status: DC | PRN
  Administered 2024-02-05: 1000 mL

## 2024-02-05 MED ORDER — CEFAZOLIN SODIUM-DEXTROSE 2-4 GM/100ML-% IV SOLN
2.0000 g | INTRAVENOUS | Status: AC
  Administered 2024-02-05: 2 g via INTRAVENOUS

## 2024-02-05 MED ORDER — TRANEXAMIC ACID-NACL 1000-0.7 MG/100ML-% IV SOLN
1000.0000 mg | INTRAVENOUS | Status: AC
  Administered 2024-02-05: 1000 mg via INTRAVENOUS

## 2024-02-05 MED ORDER — BUPIVACAINE-EPINEPHRINE (PF) 0.25% -1:200000 IJ SOLN
INTRAMUSCULAR | Status: DC | PRN
  Administered 2024-02-05: 45 mL

## 2024-02-05 MED ORDER — METOCLOPRAMIDE HCL 5 MG/ML IJ SOLN: 5.0000 mg | Freq: Three times a day (TID) | INTRAMUSCULAR | Status: DC | PRN

## 2024-02-05 MED ORDER — METOCLOPRAMIDE HCL 5 MG PO TABS: 5.0000 mg | ORAL_TABLET | Freq: Three times a day (TID) | ORAL | Status: DC | PRN

## 2024-02-05 MED ORDER — ZOLPIDEM TARTRATE 5 MG PO TABS
5.0000 mg | ORAL_TABLET | Freq: Every evening | ORAL | Status: DC | PRN
  Administered 2024-02-09: 5 mg via ORAL
  Filled 2024-02-05: qty 1

## 2024-02-05 MED ORDER — FERROUS SULFATE 325 (65 FE) MG PO TABS
325.0000 mg | ORAL_TABLET | Freq: Every day | ORAL | Status: DC
  Filled 2024-02-05: qty 1

## 2024-02-05 MED ORDER — PHENOL 1.4 % MT LIQD: 1.0000 | OROMUCOSAL | Status: DC | PRN

## 2024-02-05 MED ORDER — BISACODYL 5 MG PO TBEC: 5.0000 mg | DELAYED_RELEASE_TABLET | Freq: Every day | ORAL | Status: DC | PRN

## 2024-02-05 MED ORDER — UMECLIDINIUM BROMIDE 62.5 MCG/ACT IN AEPB
1.0000 | INHALATION_SPRAY | Freq: Every day | RESPIRATORY_TRACT | Status: DC
  Filled 2024-02-05: qty 7

## 2024-02-05 MED ORDER — EPHEDRINE SULFATE-NACL 50-0.9 MG/10ML-% IV SOSY
PREFILLED_SYRINGE | INTRAVENOUS | Status: DC | PRN
  Administered 2024-02-05: 5 mg via INTRAVENOUS
  Administered 2024-02-05: 10 mg via INTRAVENOUS
  Administered 2024-02-05 (×2): 5 mg via INTRAVENOUS

## 2024-02-05 MED ADMIN — henylephrine-NaCl Pref Syr 0.8 MG/10ML-0.9% (80 MCG/ML): 80 ug | INTRAVENOUS | NDC 70092148646

## 2024-02-05 MED ADMIN — henylephrine-NaCl Pref Syr 0.8 MG/10ML-0.9% (80 MCG/ML): 160 ug | INTRAVENOUS | NDC 70092148646

## 2024-02-05 MED ADMIN — Ondansetron HCl Inj 4 MG/2ML (2 MG/ML): 4 mg | INTRAVENOUS | NDC 00409475518

## 2024-02-05 MED FILL — Propofol IV Emul 200 MG/20ML (10 MG/ML): INTRAVENOUS | Qty: 20 | Status: AC

## 2024-02-05 MED FILL — henylephrine-NaCl Pref Syr 0.8 MG/10ML-0.9% (80 MCG/ML): INTRAVENOUS | Qty: 10 | Status: AC

## 2024-02-05 MED FILL — Acetaminophen IV Soln 10 MG/ML: INTRAVENOUS | Qty: 100 | Status: AC

## 2024-02-05 MED FILL — Tranexamic Acid-Sodium Chloride IV Soln 1000 MG/100ML-0.7%: INTRAVENOUS | Qty: 100 | Status: AC

## 2024-02-05 SURGICAL SUPPLY — 36 items
BIT DRILL CANN 16 HIP (BIT) IMPLANT
BIT DRILL CANN STP 6/9 HIP (BIT) IMPLANT
BIT DRILL LONG 4.2 (BIT) IMPLANT
BIT DRILL TAPERED 10 (BIT) IMPLANT
BLADE TFNA HELICAL 95 NON STRL (Anchor) IMPLANT
BNDG COHESIVE 4X5 TAN STRL LF (GAUZE/BANDAGES/DRESSINGS) ×1 IMPLANT
CHLORAPREP W/TINT 26 (MISCELLANEOUS) ×2 IMPLANT
DRAPE C-ARMOR (DRAPES) IMPLANT
DRAPE INCISE 23X17 STRL (DRAPES) ×1 IMPLANT
DRSG AQUACEL AG ADV 3.5X14 (GAUZE/BANDAGES/DRESSINGS) IMPLANT
DRSG XEROFORM 1X8 (GAUZE/BANDAGES/DRESSINGS) IMPLANT
ELECTRODE REM PT RTRN 9FT ADLT (ELECTROSURGICAL) ×1 IMPLANT
GAUZE SPONGE 4X4 12PLY STRL (GAUZE/BANDAGES/DRESSINGS) ×2 IMPLANT
GLOVE INDICATOR 8.0 STRL GRN (GLOVE) ×1 IMPLANT
GLOVE SURG ORTHO 8.5 STRL (GLOVE) ×1 IMPLANT
GOWN STRL REUS W/ TWL LRG LVL3 (GOWN DISPOSABLE) ×1 IMPLANT
GOWN STRL REUS W/TWL LRG LVL4 (GOWN DISPOSABLE) ×1 IMPLANT
GUIDEWIRE 3.2X400 (WIRE) IMPLANT
IMPL DEG TI CANN 11MM/130 (Orthopedic Implant) IMPLANT
KIT TURNOVER KIT A (KITS) ×1 IMPLANT
MANIFOLD NEPTUNE II (INSTRUMENTS) ×1 IMPLANT
MAT ABSORB FLUID 56X50 GRAY (MISCELLANEOUS) ×1 IMPLANT
NDL SPNL 18GX3.5 QUINCKE PK (NEEDLE) ×1 IMPLANT
NEEDLE SPNL 18GX3.5 QUINCKE PK (NEEDLE) ×1 IMPLANT
NS IRRIG 500ML POUR BTL (IV SOLUTION) ×1 IMPLANT
PACK HIP COMPR (MISCELLANEOUS) ×1 IMPLANT
PAD ABD DERMACEA PRESS 5X9 (GAUZE/BANDAGES/DRESSINGS) ×2 IMPLANT
SCREW LOCK STAR 5X36 (Screw) IMPLANT
SOLUTION PREP PVP 2OZ (MISCELLANEOUS) ×1 IMPLANT
STAPLER SKIN PROX 35W (STAPLE) ×1 IMPLANT
SUCTION TUBE FRAZIER 10FR DISP (SUCTIONS) ×1 IMPLANT
SUT VIC AB 0 CT1 36 (SUTURE) ×1 IMPLANT
SUT VIC AB 2-0 CT1 (SUTURE) ×1 IMPLANT
SYR 30ML LL (SYRINGE) ×1 IMPLANT
TRAP FLUID SMOKE EVACUATOR (MISCELLANEOUS) ×1 IMPLANT
WATER STERILE IRR 500ML POUR (IV SOLUTION) ×1 IMPLANT

## 2024-02-05 NOTE — ED Provider Notes (Signed)
 [facility] Provider Note    Event Date/Time   First MD Initiated Contact with Patient 02/05/24 [phone]     (approximate)   History   Chief Complaint Fall   HPI  Catherine Scott is a 83 y.o. female with past medical history of COPD and colon cancer status post partial colectomy who presents to the ED complaining of fall.  Patient reports that last night she was going to check the security device by the front door of her home when she tripped and fell to the ground.  She does not think she hit her head and denies losing consciousness, does report significant pain around her right hip.  She was unable to stand up and bear weight on the leg, was able to drag herself to the couch, where she contacted her caregiver.  Caregiver then found her this morning, unable to walk and with significant right hip pain, subsequently contacted EMS.  Patient denies any headache or neck pain, does not take a blood thinner.  She denies any recent fevers, cough, chest pain, shortness of breath, nausea, vomiting, diarrhea, or dysuria.      Physical Exam   Triage Vital Signs: ED Triage Vitals [02/05/24 0840]  Encounter Vitals Group     BP (!) 179/98     Girls Systolic BP Percentile      Girls Diastolic BP Percentile      Boys Systolic BP Percentile      Boys Diastolic BP Percentile      Pulse Rate 89     Resp 18     Temp      Temp src      SpO2 100 %     Weight      Height      Head Circumference      Peak Flow      Pain Score 10     Pain Loc      Pain Education      Exclude from Growth Chart     Most recent vital signs: Vitals:   02/05/24 0930 02/05/24 1000  BP: (!) 146/82 130/72  Pulse: 85 74  Resp: 20 16  SpO2: 97% 98%    Constitutional: Alert and oriented. Eyes: Conjunctivae are normal. Head: Atraumatic. Nose: No congestion/rhinnorhea. Mouth/Throat: Mucous membranes are moist.  Neck: No midline cervical spine tenderness to palpation. Cardiovascular:  Normal rate, regular rhythm. Grossly normal heart sounds.  2+ radial and DP pulses bilaterally. Respiratory: Normal respiratory effort.  No retractions. Lungs CTAB.  No chest wall tenderness to palpation. Gastrointestinal: Soft and nontender. No distention. Musculoskeletal: Diffuse tenderness to right hip with shortening, no tenderness to palpation at left hip, bilateral knees, or bilateral ankles.  No upper extremity bony tenderness to palpation. Neurologic:  Normal speech and language. No gross focal neurologic deficits are appreciated.    ED Results / Procedures / Treatments   Labs (all labs ordered are listed, but only abnormal results are displayed) Labs Reviewed  CBC WITH DIFFERENTIAL/PLATELET - Abnormal; Notable for the following components:      Result Value   WBC 12.6 (*)    Neutro Abs 11.4 (*)    Lymphs Abs 0.5 (*)    All other components within normal limits  COMPREHENSIVE METABOLIC PANEL WITH GFR - Abnormal; Notable for the following components:   Sodium 134 (*)    Chloride 94 (*)    Glucose, Bld 147 (*)    Alkaline Phosphatase 132 (*)  Anion gap 17 (*)    All other components within normal limits  CK - Abnormal; Notable for the following components:   Total CK 311 (*)    All other components within normal limits  URINALYSIS, ROUTINE W REFLEX MICROSCOPIC  PROTIME-INR  VITAMIN D 25 HYDROXY (VIT D DEFICIENCY, FRACTURES)  TYPE AND SCREEN     EKG  ED ECG REPORT I, Carlin Palin, the attending physician, personally viewed and interpreted this ECG.   Date: 02/05/2024  EKG Time: 8:44  Rate: 82  Rhythm: normal sinus rhythm  Axis: Normal  Intervals:none  ST&T Change: None  RADIOLOGY Right hip x-ray reviewed and interpreted by me with intertrochanteric fracture, no dislocation.  PROCEDURES:  Critical Care performed: No  Procedures   MEDICATIONS ORDERED IN ED: Medications  lactated ringers bolus 1,000 mL (has no administration in time range)  0.9 %   sodium chloride infusion (has no administration in time range)  ceFAZolin (ANCEF) IVPB 2g/100 mL premix (has no administration in time range)  chlorhexidine (HIBICLENS) 4 % liquid (has no administration in time range)  morphine (PF) 2 MG/ML injection 1 mg (has no administration in time range)  tranexamic acid (CYKLOKAPRON) IVPB 1,000 mg (0 mg Intravenous Hold 02/05/24 1021)  escitalopram  (LEXAPRO ) tablet 10 mg (has no administration in time range)  gabapentin  (NEURONTIN ) capsule 300 mg (has no administration in time range)  umeclidinium bromide (INCRUSE ELLIPTA) 62.5 MCG/ACT 1 puff (has no administration in time range)  fluticasone furoate-vilanterol (BREO ELLIPTA) 100-25 MCG/ACT 1 puff (has no administration in time range)  HYDROcodone-acetaminophen (NORCO/VICODIN) 5-325 MG per tablet 1-2 tablet (has no administration in time range)  enoxaparin (LOVENOX) injection 40 mg (has no administration in time range)  HYDROmorphone (DILAUDID) injection 0.5 mg (has no administration in time range)  senna-docusate (Senokot-S) tablet 1 tablet (has no administration in time range)  bisacodyl (DULCOLAX) EC tablet 5 mg (has no administration in time range)  acetaminophen (TYLENOL) tablet 650 mg (has no administration in time range)  ondansetron (ZOFRAN) injection 4 mg (has no administration in time range)  metoprolol tartrate (LOPRESSOR) tablet 12.5 mg (has no administration in time range)  aspirin EC tablet 81 mg (has no administration in time range)  morphine (PF) 4 MG/ML injection 4 mg (4 mg Intravenous Given 02/05/24 0849)     IMPRESSION / MDM / ASSESSMENT AND PLAN / ED COURSE  I reviewed the triage vital signs and the nursing notes.                              83 y.o. female with past medical history of COPD and colon cancer status post partial colectomy who presents to the ED complaining of right hip pain following trip and fall last night. Patient's presentation is most consistent with acute  presentation with potential threat to life or bodily function.  Differential diagnosis includes, but is not limited to, fracture, dislocation, intracranial injury, cervical spine injury, rhabdomyolysis, dehydration, lactate abnormality, AKI, UTI.  Patient nontoxic-appearing and in no acute distress, vital signs are unremarkable.  She does have diffuse tenderness to the right hip with shortening noted, we will further assess with x-ray.  She is neurovascularly intact distally, will check CT head and cervical spine, but no evidence of traumatic injury to her trunk or other extremities.  We will treat pain with IV morphine, labs and urinalysis are also pending.  CT head and cervical spine are negative for acute traumatic  injury, right hip x-ray does show intertrochanteric fracture with no dislocation.  Labs with mild elevation in CK level but no significant anemia, leukocytosis, electrolyte abnormality, or AKI.  Pain improved on reassessment and case discussed with Dr. Cleotilde of orthopedics, who will plan for operative intervention later today.  Chest x-ray for medical clearance did note pulmonary nodule, which was discussed with hospitalist, Dr. Laurita, who feels that it is chronic given stability compared to prior imaging.  Dr. Laurita to admit the patient to the hospitalist service.      FINAL CLINICAL IMPRESSION(S) / ED DIAGNOSES   Final diagnoses:  Fall, initial encounter  Closed fracture of right hip, initial encounter [facility])     Rx / DC Orders   ED Discharge Orders     None        Note:  This document was prepared using Dragon voice recognition software and may include unintentional dictation errors.   Willo Dunnings, MD 02/05/24 1102

## 2024-02-05 NOTE — Progress Notes (Signed)
 Initial Nutrition Assessment  DOCUMENTATION CODES:   Not applicable  INTERVENTION:   -Once diet is advanced, add:   -Ensure Plus High Protein po BID, each supplement provides 350 kcal and 20 grams of protein  -MVI with minerals daily  NUTRITION DIAGNOSIS:   Increased nutrient needs related to post-op healing as evidenced by estimated needs.  GOAL:   Patient will meet greater than or equal to 90% of their needs  MONITOR:   PO intake, Supplement acceptance, Diet advancement  REASON FOR ASSESSMENT:   Consult Assessment of nutrition requirement/status, Hip fracture protocol  ASSESSMENT:   PMH of COPD, colon cancer status post colectomy and colostomy, presented with fall and right hip fracture.  Admitted with rt femur fracture s/p mechanical fall.   Patient unavailable at time of visit. RD unable to obtain further nutrition-related history or complete nutrition-focused physical exam at this time.    She is awaiting orthopedics consult; noted to be NPO in anticipation of procedure.   Per H&P, family has reported that she has been gaining weight secondary to improved nutrition. She lives alone and is able to ambulate short distances, including 15 minutes walks.   No weight loss noted over the past 4 months. Per reviewed of weight history, UBW is around 112#.   Catherine Scott has increased nutritional needs to support post-operative healing and would benefit from addition of oral nutrition supplements.   Medications reviewed and include lovenox, neurontin , and sodium chloride infusion @ 75 ml/hr.   Labs reviewed: Na: 134.    Diet Order:   Diet Order             Diet NPO time specified  Diet effective now                   EDUCATION NEEDS:   No education needs have been identified at this time  Skin:  Skin Assessment: Reviewed RN Assessment  Last BM:  Unknown  Height:   Ht Readings from Last 1 Encounters:  09/16/23 5' 1 (1.549 m)    Weight:   Wt  Readings from Last 1 Encounters:  02/05/24 52 kg    Ideal Body Weight:  47.7 kg  BMI:  Body mass index is 21.66 kg/m.  Estimated Nutritional Needs:   Kcal:  1550-1750  Protein:  80-95 grams  Fluid:  1.5-1.7 L    Catherine Scott, RD, LDN, CDCES Registered Dietitian III Certified Diabetes Care and Education Specialist If unable to reach this RD, please use RD Inpatient group chat on secure chat between hours of 8am-4 pm daily

## 2024-02-05 NOTE — H&P (Signed)
THE PATIENT WAS SEEN PRIOR TO SURGERY TODAY.  HISTORY, ALLERGIES, HOME MEDICATIONS AND OPERATIVE PROCEDURE WERE REVIEWED. RISKS AND BENEFITS OF SURGERY DISCUSSED WITH PATIENT AGAIN.  NO CHANGES FROM INITIAL HISTORY AND PHYSICAL NOTED.    

## 2024-02-05 NOTE — Consult Note (Signed)
 ORTHOPAEDIC CONSULTATION  REQUESTING PHYSICIAN: Laurita Cort DASEN, MD  Chief Complaint: Right hip pain  HPI: Catherine Scott is a 83 y.o. female who complains of right hip pain.  The patient fell on her doorstep last evening and injured her right hip.  She lay on the floor overnight and was able to get to the phone and call neighbors and family this morning.  She was brought to the emergency room where exam and x-rays reveal a comminuted displaced intertrochanteric fracture of the right hip.  She has been seen by the hospitalist service and cleared for surgery.  Her health is generally good with a history of colon cancer and COPD.  She is not on any blood thinners.  Her daughter is present.  The risks and benefits of surgery versus nonoperative treatment were discussed with the patient and her daughter and they wish to proceed with surgery.  Will plan to do this this afternoon.  Past Medical History:  Diagnosis Date   Asthma    Cancer (HCC)    colon   COPD (chronic obstructive pulmonary disease) (HCC)    Past Surgical History:  Procedure Laterality Date   COLON SURGERY  2008   Social History   Socioeconomic History   Marital status: Widowed    Spouse name: Not on file   Number of children: 2   Years of education: Not on file   Highest education level: Not on file  Occupational History   Not on file  Tobacco Use   Smoking status: Every Day    Current packs/day: 0.50    Average packs/day: 0.5 packs/day for 55.0 years (27.5 ttl pk-yrs)    Types: Cigarettes   Smokeless tobacco: Never  Vaping Use   Vaping status: Never Used  Substance and Sexual Activity   Alcohol use: Not Currently   Drug use: Never   Sexual activity: Not Currently  Other Topics Concern   Not on file  Social History Narrative   1 miscarriage    Social Drivers of Health   Financial Resource Strain: Not on file  Food Insecurity: No Food Insecurity (09/16/2023)   Hunger Vital Sign    Worried About Running  Out of Food in the Last Year: Never true    Ran Out of Food in the Last Year: Never true  Transportation Needs: No Transportation Needs (09/16/2023)   PRAPARE - Administrator, Civil Service (Medical): No    Lack of Transportation (Non-Medical): No  Physical Activity: Not on file  Stress: Not on file  Social Connections: Not on file   Family History  Problem Relation Age of Onset   Diabetes Mother    Cancer Brother    No Known Allergies Prior to Admission medications   Medication Sig Start Date End Date Taking? Authorizing Provider  acetaminophen (TYLENOL) 325 MG tablet Take 650 mg by mouth every 6 (six) hours as needed for mild pain (pain score 1-3).   Yes [provider]  ATROVENT  HFA 17 MCG/ACT inhaler Inhale 2 puffs into the lungs every 6 (six) hours as needed. 03/24/23  Yes Manya Toribio SHAUNNA, PA  escitalopram  (LEXAPRO ) 10 MG tablet TAKE 1 TABLET BY MOUTH EVERY DAY 01/11/24  Yes Manya Toribio SHAUNNA, PA  gabapentin  (NEURONTIN ) 300 MG capsule TAKE 1 CAPSULE BY MOUTH THREE TIMES A DAY 12/01/23  Yes Manya Toribio SHAUNNA, PA  SYMBICORT  160-4.5 MCG/ACT inhaler Inhale 2 puffs into the lungs 2 (two) times daily. 03/24/23  Yes Manya Toribio SHAUNNA,  PA   DG Chest Port 1 View Result Date: 02/05/2024 EXAM: 1 VIEW XRAY OF THE CHEST 02/05/2024 09:26:00 AM COMPARISON: 03/07/219 CLINICAL HISTORY: 809823 Fall 809823. Fall 809823. FINDINGS: LUNGS AND PLEURA: Hyperinflation of the lungs is noted. Biapical scarring is noted. Increased nodular density is noted in left lung apex concerning for possible nodule or mass. Possible minimal bilateral pleural effusions. No pulmonary edema. No pneumothorax. HEART AND MEDIASTINUM: No acute abnormality of the cardiac and mediastinal silhouettes. BONES AND SOFT TISSUES: No acute osseous abnormality. IMPRESSION: 1. Increased nodular density in the left lung apex suspicious for a pulmonary nodule or mass. CT scan of chest is recommended for further evaluation . 2.  Trace bilateral pleural effusions. 3. Biapical scarring. 4. Pulmonary hyperinflation. Electronically signed by: Lynwood Seip MD 02/05/2024 09:44 AM EDT RP Workstation: [id number]   DG Hip Unilat W or Wo Pelvis 2-3 Views Right Result Date: 02/05/2024 EXAM: 2 or 3 VIEW(S) XRAY OF THE RIGHT HIP 02/05/2024 09:26:00 AM COMPARISON: None available. CLINICAL HISTORY: Fall. Pt to ED via [facility] from home for mechanical fall and R hip pain. pt fell last night at 9pm. Denies hitting head, no LOC, denies blood thinners. Pt was able to crawl to couch to call caregiver for assistance. FINDINGS: BONES AND JOINTS: Severely displaced and comminuted intratrochanteric fracture of proximal right femur is noted. No significant degenerative changes. SOFT TISSUES: The soft tissues are unremarkable. IMPRESSION: 1. Severely displaced comminuted intratrochanteric fracture of the proximal right femur. Electronically signed by: Lynwood Seip MD 02/05/2024 09:41 AM EDT RP Workstation: [id number]   CT Cervical Spine Wo Contrast Result Date: 02/05/2024 EXAM: CT CERVICAL SPINE WITHOUT CONTRAST 02/05/2024 09:07:07 AM TECHNIQUE: CT of the cervical spine was performed without the administration of intravenous contrast. Multiplanar reformatted images are provided for review. Automated exposure control, iterative reconstruction, and/or weight based adjustment of the mA/kV was utilized to reduce the radiation dose to as low as reasonably achievable. COMPARISON: None available. CLINICAL HISTORY: Neck trauma (Age >= 65y). mechanical fall and R hip pain. FINDINGS: CERVICAL SPINE: BONES AND ALIGNMENT: No acute fracture or traumatic malalignment. There is a mild levocurvature of the cervical spine. There is slight degenerative anterolisthesis at C4-C5. DEGENERATIVE CHANGES: There is moderate chronic degenerative disc disease at C5-C6 and C6-C7, with posterior endplate spurring resulting in moderate central spinal canal stenosis at both levels as well as  mild-to-moderate bilateral neuroforaminal stenosis. There is moderate diffuse facet arthrosis present. SOFT TISSUES: No prevertebral soft tissue swelling. LUNGS: There is biapical pleural parenchymal scarring. IMPRESSION: 1. No acute abnormality of the cervical spine related to the reported neck trauma. 2. 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 Electronically signed by: Evalene Coho MD 02/05/2024 09:33 AM EDT RP Workstation: [id number]   CT Head Wo Contrast Result Date: 02/05/2024 EXAM: CT HEAD WITHOUT CONTRAST 02/05/2024 09:07:07 AM TECHNIQUE: CT of the head was performed without the administration of intravenous contrast. Automated exposure control, iterative reconstruction, and/or weight based adjustment of the mA/kV was utilized to reduce the radiation dose to as low as reasonably achievable. COMPARISON: None available. CLINICAL HISTORY: Head trauma, minor (Age >= 65y). mechanical fall and R hip pain. FINDINGS: BRAIN AND VENTRICLES: No acute hemorrhage. No evidence of acute infarct. 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 There is chronic lacunar infarct within the right caudate nucleus. Vascular calcifications. No hydrocephalus. No  extra-axial collection. No mass effect or midline shift. ORBITS: Bilateral lens replacement. No acute abnormality. SINUSES: No acute abnormality. SOFT TISSUES AND SKULL: No acute soft tissue abnormality. No skull fracture. IMPRESSION: 1. No acute intracranial abnormality related to the head trauma. 2. 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 3. 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 Electronically signed by: Evalene Coho MD 02/05/2024 09:14 AM EDT RP  Workstation: [id number]    Positive ROS: All other systems have been reviewed and were otherwise negative with the exception of those mentioned in the HPI and as above.  Physical Exam: General: Alert, no acute distress Cardiovascular: No pedal edema Respiratory: No cyanosis, no use of accessory musculature GI: No organomegaly, abdomen is soft and non-tender Skin: No lesions in the area of chief complaint Neurologic: Sensation intact distally Psychiatric: Patient is competent for consent with normal mood and affect Lymphatic: No axillary or cervical lymphadenopathy  MUSCULOSKELETAL: The patient is alert and lying quietly on the stretcher.  Her right leg is shortened and externally rotated.  Neurovascular status good distally.  The skin is intact.  There is severe pain with attempts to move the hip.  The left lower extremity is normal to exam.  The upper extremities are normal to exam.  The spine is nontender.  Assessment: Displaced intertrochanteric fracture right hip  Plan: Right hip nailing with a intertrochanteric fixation nail    Kayla FORBES Pinal, MD [phone]   02/05/2024 1:14 PM

## 2024-02-05 NOTE — ED Notes (Signed)
 Pt transported to CT at this time.

## 2024-02-05 NOTE — Op Note (Signed)
 DATE OF SURGERY:  02/05/2024  TIME: 3:36 PM  PATIENT NAME:  Catherine Scott  AGE: 83 y.o.  PRE-OPERATIVE DIAGNOSIS:  right comminuted, displaced, intertrochanteric femur fracture  POST-OPERATIVE DIAGNOSIS:  SAME  PROCEDURE:  FIXATION, FRACTURE, right INTERTROCHANTERIC, WITH INTRAMEDULLARY ROD  SURGEON:  Kayla FORBES Pinal  ASST:  EBL: 50 cc  COMPLICATIONS: None  OPERATIVE IMPLANTS: Synthes trochanteric femoral nail 11 mm / 130 degrees with interlocking helical blade 95 mm and distal locking screw 36 mm.  PREOPERATIVE INDICATIONS:  [name] is a 83 y.o. year old who fell and suffered a hip fracture. She was brought into the ER and then admitted and optimized and then elected for surgical intervention.    The risks benefits and alternatives were discussed with the patient including but not limited to the risks of nonoperative treatment, versus surgical intervention including infection, bleeding, nerve injury, malunion, nonunion, hardware prominence, hardware failure, need for hardware removal, blood clots, cardiopulmonary complications, morbidity, mortality, among others, and they were willing to proceed.    OPERATIVE PROCEDURE:  The patient was brought to the operating room and placed in the supine position.  General LMA anesthesia was administered, with a foley. She was placed on the fracture table.  Closed reduction was performed under C-arm guidance. The length of the femur was also measured using fluoroscopy. Time out was then performed after sterile prep and drape. She received preoperative antibiotics.  Incision was made proximal to the greater trochanter. A guidewire was placed in the appropriate position. Confirmation was made on AP and lateral views. The above-named nail was opened. I opened the proximal femur with a reamer. I then placed the nail by hand easily down. I did not need to ream the femur.  Once the nail was completely seated, I placed a guidepin into the  femoral head into the center center position through a second incision.  I measured the length, and then reamed the lateral cortex and up into the head. I then placed the helical blade. Slight compression was applied. Anatomic fixation achieved. Bone quality was mediocre.  I then secured the proximal interlock.  The distal locking screw was then placed and after confirming the position of the fracture fragments and hardware I then removed the instruments, and took final C-arm pictures AP and lateral the entire length of the leg. Anatomic reconstruction was achieved, and the wounds were irrigated copiously and closed with Vicryl  followed by staples and dry sterile dressing. Sponge and needle count were correct.   The patient was awakened and returned to PACU in stable and satisfactory condition. There no complications and the patient tolerated the procedure well.  She will be partial weightbearing as tolerated, and will be on Lovenox  For DVT prophylaxis.     Kayla FORBES Pinal, M.D.

## 2024-02-05 NOTE — Transfer of Care (Signed)
 Immediate Anesthesia Transfer of Care Note  Patient: Catherine Scott  Procedure(s) Performed: FIXATION, FRACTURE, INTERTROCHANTERIC, WITH INTRAMEDULLARY ROD (Right: Hip)  Patient Location: PACU  Anesthesia Type:General  Level of Consciousness: drowsy  Airway & Oxygen Therapy: Patient Spontanous Breathing and Patient connected to face mask oxygen  Post-op Assessment: Report given to RN and Post -op Vital signs reviewed and stable  Post vital signs: Reviewed and stable  Last Vitals:  Vitals Value Taken Time  BP 117/62 02/05/24 15:34  Temp 37.1 1534  Pulse 83 02/05/24 15:35  Resp 14 02/05/24 15:35  SpO2 100 % 02/05/24 15:35  Vitals shown include unfiled device data.  Last Pain:  Vitals:   02/05/24 1226  TempSrc: Temporal  PainSc: 6          Complications: No notable events documented.

## 2024-02-05 NOTE — Plan of Care (Signed)

## 2024-02-05 NOTE — Anesthesia Postprocedure Evaluation (Signed)
 Anesthesia Post Note  Patient: Catherine Scott  Procedure(s) Performed: FIXATION, FRACTURE, INTERTROCHANTERIC, WITH INTRAMEDULLARY ROD (Right: Hip)  Patient location during evaluation: PACU Anesthesia Type: General Level of consciousness: awake and alert Pain management: pain level controlled Vital Signs Assessment: post-procedure vital signs reviewed and stable Respiratory status: spontaneous breathing, nonlabored ventilation, respiratory function stable and patient connected to nasal cannula oxygen Cardiovascular status: blood pressure returned to baseline and stable Postop Assessment: no apparent nausea or vomiting Anesthetic complications: no   No notable events documented.   Last Vitals:  Vitals:   02/05/24 1620 02/05/24 1640  BP:  134/72  Pulse: 83 86  Resp: 15 16  Temp:  37.1 C  SpO2: 95% 94%    Last Pain:  Vitals:   02/05/24 1640  TempSrc: Oral  PainSc: 0-No pain                 Debby Mines

## 2024-02-05 NOTE — Anesthesia Preprocedure Evaluation (Signed)
 Anesthesia Evaluation  Patient identified by MRN, date of birth, ID band Patient awake    Reviewed: Allergy & Precautions, NPO status , Patient's Chart, lab work & pertinent test results  Airway Mallampati: III  TM Distance: >3 FB Neck ROM: full    Dental  (+) Chipped   Pulmonary COPD,  COPD inhaler, Current Smoker   Pulmonary exam normal        Cardiovascular negative cardio ROS Normal cardiovascular exam     Neuro/Psych  PSYCHIATRIC DISORDERS  Depression   Dementia negative neurological ROS     GI/Hepatic negative GI ROS, Neg liver ROS,,,  Endo/Other  negative endocrine ROS    Renal/GU      Musculoskeletal   Abdominal   Peds  Hematology negative hematology ROS (+)   Anesthesia Other Findings Past Medical History: No date: Asthma No date: Cancer (HCC)     Comment:  colon No date: COPD (chronic obstructive pulmonary disease) (HCC)  Past Surgical History: 2008: COLON SURGERY  BMI    Body Mass Index: 21.66 kg/m      Reproductive/Obstetrics negative OB ROS                              Anesthesia Physical Anesthesia Plan  ASA: 3  Anesthesia Plan: General   Post-op Pain Management:    Induction: Intravenous  PONV Risk Score and Plan: 3 and Ondansetron and Dexamethasone  Airway Management Planned: LMA  Additional Equipment:   Intra-op Plan:   Post-operative Plan: Extubation in OR  Informed Consent: I have reviewed the patients History and Physical, chart, labs and discussed the procedure including the risks, benefits and alternatives for the proposed anesthesia with the patient or authorized representative who has indicated his/her understanding and acceptance.     Dental Advisory Given  Plan Discussed with: Anesthesiologist, CRNA and Surgeon  Anesthesia Plan Comments: (Patient consented for risks of anesthesia including but not limited to:  - adverse reactions  to medications - damage to eyes, teeth, lips or other oral mucosa - nerve damage due to positioning  - sore throat or hoarseness - Damage to heart, brain, nerves, lungs, other parts of body or loss of life  Patient voiced understanding and assent.)        Anesthesia Quick Evaluation

## 2024-02-05 NOTE — Anesthesia Procedure Notes (Signed)
 Procedure Name: LMA Insertion Date/Time: 02/05/2024 2:10 PM  Performed by: Darrie Macmillan R, CRNAPre-anesthesia Checklist: Patient identified, Emergency Drugs available, Suction available, Patient being monitored and Timeout performed Patient Re-evaluated:Patient Re-evaluated prior to induction Oxygen Delivery Method: Circle system utilized Preoxygenation: Pre-oxygenation with 100% oxygen Induction Type: IV induction LMA: LMA inserted LMA Size: 3.0 Number of attempts: 1 Placement Confirmation: positive ETCO2 and breath sounds checked- equal and bilateral Tube secured with: Tape Dental Injury: Teeth and Oropharynx as per pre-operative assessment

## 2024-02-05 NOTE — Progress Notes (Signed)
*  PRELIMINARY RESULTS* Echocardiogram 2D Echocardiogram has been performed.  Catherine Scott 02/05/2024, 1:41 PM

## 2024-02-05 NOTE — ED Triage Notes (Signed)
 Pt to ED via [facility] from home for mechanical fall and R hip pain. pt fell last night at 9pm. Denies hitting head, no LOC, denies blood thinners.  Pt was able to crawl to couch to call caregiver for assistance.   EMS vitals  BP 181/91 HR 85 SPO2 98% RA

## 2024-02-05 NOTE — H&P (Addendum)
 History and Physical    FEVEN ALDERFER FMW:[id number] DOB: January 16, 1941 DOA: 02/05/2024  PCP: Manya Toribio SHAUNNA, PA (Confirm with patient/family/NH records and if not entered, this has to be entered at [facility] point of entry) Patient coming from: Home  I have personally briefly reviewed patient's old medical records in [facility]  Chief Complaint: I tripped and fell and broke my hip  HPI: MERRILLYN ACKERLEY is a 83 y.o. female with medical history significant of COPD, colon cancer status post colectomy and colostomy, presented with fall and right hip fracture.  Patient lives by herself with a dog.  According to family, patient uses roller walker occasionally.  Last night, she tripped on front door steps and fell on right hip, no LOC no head or neck injury.  With excruciating pain, she was unable to move right leg.  She crawled across the living room to call her daughter last night.  Family brought the patient lives in a one-story house, at baseline she is not very active but able to tolerate 10 to 15 minutes walk, never complained about chest pain shortness of breath. ED Course: Afebrile, nontachycardic blood pressure 130/72 O2 saturation 90% on room air.  X-ray showed displaced comminuted right intertrochanteric fracture of right femur.  Review of Systems: As per HPI otherwise 14 point review of systems negative.    Past Medical History:  Diagnosis Date   Asthma    Cancer (HCC)    colon   COPD (chronic obstructive pulmonary disease) (HCC)     Past Surgical History:  Procedure Laterality Date   COLON SURGERY  2008     reports that she has been smoking cigarettes. She has a 27.5 pack-year smoking history. She has never used smokeless tobacco. She reports that she does not currently use alcohol. She reports that she does not use drugs.  No Known Allergies  Family History  Problem Relation Age of Onset   Diabetes Mother    Cancer Brother      Prior to Admission medications    Medication Sig Start Date End Date Taking? Authorizing Provider  ATROVENT  HFA 17 MCG/ACT inhaler Inhale 2 puffs into the lungs every 6 (six) hours as needed. 03/24/23   Manya Toribio SHAUNNA, PA  escitalopram  (LEXAPRO ) 10 MG tablet TAKE 1 TABLET BY MOUTH EVERY DAY 01/11/24   Manya Toribio SHAUNNA, PA  gabapentin  (NEURONTIN ) 300 MG capsule TAKE 1 CAPSULE BY MOUTH THREE TIMES A DAY 12/01/23   Manya Toribio SHAUNNA, PA  SYMBICORT  160-4.5 MCG/ACT inhaler Inhale 2 puffs into the lungs 2 (two) times daily. 03/24/23   Manya Toribio SHAUNNA, PA    Physical Exam: Vitals:   02/05/24 0840 02/05/24 0841 02/05/24 0930 02/05/24 1000  BP: (!) 179/98  (!) 146/82 130/72  Pulse: 89  85 74  Resp: 18  20 16   SpO2: 100%  97% 98%  Weight:  52 kg      Constitutional: NAD, calm, comfortable Vitals:   02/05/24 0840 02/05/24 0841 02/05/24 0930 02/05/24 1000  BP: (!) 179/98  (!) 146/82 130/72  Pulse: 89  85 74  Resp: 18  20 16   SpO2: 100%  97% 98%  Weight:  52 kg     Eyes: PERRL, lids and conjunctivae normal ENMT: Mucous membranes are moist. Posterior pharynx clear of any exudate or lesions.Normal dentition.  Neck: normal, supple, no masses, no thyromegaly Respiratory: clear to auscultation bilaterally, no wheezing, no crackles. Normal respiratory effort. No accessory muscle use.  Cardiovascular: Regular rate  and rhythm, no murmurs / rubs / gallops. No extremity edema. 2+ pedal pulses. No carotid bruits.  Abdomen: no tenderness, no masses palpated. No hepatosplenomegaly. Bowel sounds positive.  Musculoskeletal: Right leg rotated and shortened Skin: no rashes, lesions, ulcers. No induration Neurologic: CN 2-12 grossly intact. Sensation intact, DTR normal. Strength 5/5 in all 4.  Psychiatric: Normal judgment and insight. Alert and oriented x 3. Normal mood.     Labs on Admission: I have personally reviewed following labs and imaging studies  CBC: Recent Labs  Lab 02/05/24 0842  WBC 12.6*  NEUTROABS 11.4*  HGB 12.9   HCT 38.8  MCV 93.0  PLT 241   Basic Metabolic Panel: Recent Labs  Lab 02/05/24 0842  NA 134*  K 4.1  CL 94*  CO2 23  GLUCOSE 147*  BUN 20  CREATININE 0.49  CALCIUM 9.2   GFR: Estimated Creatinine Clearance: 38.8 mL/min (by C-G formula based on SCr of 0.49 mg/dL). Liver Function Tests: Recent Labs  Lab 02/05/24 0842  AST 38  ALT 22  ALKPHOS 132*  BILITOT 1.2  PROT 7.8  ALBUMIN 3.9   No results for input(s): LIPASE, AMYLASE in the last 168 hours. No results for input(s): AMMONIA in the last 168 hours. Coagulation Profile: No results for input(s): INR, PROTIME in the last 168 hours. Cardiac Enzymes: Recent Labs  Lab 02/05/24 0842  CKTOTAL 311*   BNP (last 3 results) No results for input(s): PROBNP in the last 8760 hours. HbA1C: No results for input(s): HGBA1C in the last 72 hours. CBG: No results for input(s): GLUCAP in the last 168 hours. Lipid Profile: No results for input(s): CHOL, HDL, LDLCALC, TRIG, CHOLHDL, LDLDIRECT in the last 72 hours. Thyroid Function Tests: No results for input(s): TSH, T4TOTAL, FREET4, T3FREE, THYROIDAB in the last 72 hours. Anemia Panel: No results for input(s): VITAMINB12, FOLATE, FERRITIN, TIBC, IRON, RETICCTPCT in the last 72 hours. Urine analysis: No results found for: COLORURINE, APPEARANCEUR, LABSPEC, PHURINE, GLUCOSEU, HGBUR, BILIRUBINUR, KETONESUR, PROTEINUR, UROBILINOGEN, NITRITE, LEUKOCYTESUR  Radiological Exams on Admission: DG Chest Port 1 View Result Date: 02/05/2024 EXAM: 1 VIEW XRAY OF THE CHEST 02/05/2024 09:26:00 AM COMPARISON: 03/07/219 CLINICAL HISTORY: 809823 Fall 809823. Fall 809823. FINDINGS: LUNGS AND PLEURA: Hyperinflation of the lungs is noted. Biapical scarring is noted. Increased nodular density is noted in left lung apex concerning for possible nodule or mass. Possible minimal bilateral pleural effusions. No pulmonary edema. No  pneumothorax. HEART AND MEDIASTINUM: No acute abnormality of the cardiac and mediastinal silhouettes. BONES AND SOFT TISSUES: No acute osseous abnormality. IMPRESSION: 1. Increased nodular density in the left lung apex suspicious for a pulmonary nodule or mass. CT scan of chest is recommended for further evaluation . 2. Trace bilateral pleural effusions. 3. Biapical scarring. 4. Pulmonary hyperinflation. Electronically signed by: Lynwood Seip MD 02/05/2024 09:44 AM EDT RP Workstation: [id number]   DG Hip Unilat W or Wo Pelvis 2-3 Views Right Result Date: 02/05/2024 EXAM: 2 or 3 VIEW(S) XRAY OF THE RIGHT HIP 02/05/2024 09:26:00 AM COMPARISON: None available. CLINICAL HISTORY: Fall. Pt to ED via [facility] from home for mechanical fall and R hip pain. pt fell last night at 9pm. Denies hitting head, no LOC, denies blood thinners. Pt was able to crawl to couch to call caregiver for assistance. FINDINGS: BONES AND JOINTS: Severely displaced and comminuted intratrochanteric fracture of proximal right femur is noted. No significant degenerative changes. SOFT TISSUES: The soft tissues are unremarkable. IMPRESSION: 1. Severely displaced comminuted intratrochanteric fracture of the proximal  right femur. Electronically signed by: Lynwood Seip MD 02/05/2024 09:41 AM EDT RP Workstation: [id number]   CT Cervical Spine Wo Contrast Result Date: 02/05/2024 EXAM: CT CERVICAL SPINE WITHOUT CONTRAST 02/05/2024 09:07:07 AM TECHNIQUE: CT of the cervical spine was performed without the administration of intravenous contrast. Multiplanar reformatted images are provided for review. Automated exposure control, iterative reconstruction, and/or weight based adjustment of the mA/kV was utilized to reduce the radiation dose to as low as reasonably achievable. COMPARISON: None available. CLINICAL HISTORY: Neck trauma (Age >= 65y). mechanical fall and R hip pain. FINDINGS: CERVICAL SPINE: BONES AND ALIGNMENT: No acute fracture or traumatic  malalignment. There is a mild levocurvature of the cervical spine. There is slight degenerative anterolisthesis at C4-C5. DEGENERATIVE CHANGES: There is moderate chronic degenerative disc disease at C5-C6 and C6-C7, with posterior endplate spurring resulting in moderate central spinal canal stenosis at both levels as well as mild-to-moderate bilateral neuroforaminal stenosis. There is moderate diffuse facet arthrosis present. SOFT TISSUES: No prevertebral soft tissue swelling. LUNGS: There is biapical pleural parenchymal scarring. IMPRESSION: 1. No acute abnormality of the cervical spine related to the reported neck trauma. 2. 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 Electronically signed by: Evalene Coho MD 02/05/2024 09:33 AM EDT RP Workstation: [id number]   CT Head Wo Contrast Result Date: 02/05/2024 EXAM: CT HEAD WITHOUT CONTRAST 02/05/2024 09:07:07 AM TECHNIQUE: CT of the head was performed without the administration of intravenous contrast. Automated exposure control, iterative reconstruction, and/or weight based adjustment of the mA/kV was utilized to reduce the radiation dose to as low as reasonably achievable. COMPARISON: None available. CLINICAL HISTORY: Head trauma, minor (Age >= 65y). mechanical fall and R hip pain. FINDINGS: BRAIN AND VENTRICLES: No acute hemorrhage. No evidence of acute infarct. 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 There is chronic lacunar infarct within the right caudate nucleus. Vascular calcifications. No hydrocephalus. No extra-axial collection. No mass effect or midline shift. ORBITS: Bilateral lens replacement. No acute abnormality. SINUSES: No acute abnormality. SOFT TISSUES AND SKULL: No acute soft tissue abnormality. No skull fracture. IMPRESSION: 1.  No acute intracranial abnormality related to the head trauma. 2. 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 3. 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 Electronically signed by: Evalene Coho MD 02/05/2024 09:14 AM EDT RP Workstation: [id number]    EKG: Independently reviewed.  Sinus rhythm, no acute ST changes.  Assessment/Plan Principal Problem:   Hip fracture (HCC) Active Problems:   Hip fracture, unspecified laterality, closed, initial encounter (HCC)  (please populate well all problems here in Problem List. (For example, if patient is on BP meds at home and you resume or decide to hold them, it is a problem that needs to be her. Same for CAD, COPD, HLD and so on)  Right displaced comminuted intertrochanteric femur fracture - Secondary to mechanical fall - Calculated revised cardiac risk for perioperative risk 2.5%, medically cleared for incoming ORIF with general anesthesia with acceptable medical risk. -Elective echocardiogram - Start patient on low-dose of metoprolol -Start aspirin  Lung nodules -B/L lung nodules noticed on both apex more on the left, I pulled patient past CAT scan in 2011 already showed similar bilateral apical fibrosis.  I  compare today's CAT scan of cervical spine which showed part of the bilateral apex with that  of 2011, there are some progress of interstitial changes might reflect what the x-ray findings are.  Recommend outpatient follow-up with PCP to repeat CAT scan.   COPD - Stable, continue ICS and LABA - As needed albuterol  Mild protein calorie malnutrition - BMI= 21.  Family believes the patient has been gaining weight recently due to improved nutrition  DVT prophylaxis: Lovenox Code Status: Full code Family Communication: Daughter at bedside Disposition Plan: Patient is sick with right hip fracture requiring ORIF, expect more than 2 midnights of  hospital stay Consults called: Orthopedic surgery Admission status: Telemetry admission   Cort ONEIDA Mana MD [facility] Pager [phone]  02/05/2024, 10:44 AM

## 2024-02-06 DIAGNOSIS — S72001A Fracture of unspecified part of neck of right femur, initial encounter for closed fracture: Secondary | ICD-10-CM | POA: Diagnosis not present

## 2024-02-06 DIAGNOSIS — D62 Acute posthemorrhagic anemia: Secondary | ICD-10-CM

## 2024-02-06 DIAGNOSIS — R918 Other nonspecific abnormal finding of lung field: Secondary | ICD-10-CM

## 2024-02-06 DIAGNOSIS — F32A Depression, unspecified: Secondary | ICD-10-CM

## 2024-02-06 DIAGNOSIS — Z8709 Personal history of other diseases of the respiratory system: Secondary | ICD-10-CM | POA: Diagnosis not present

## 2024-02-06 DIAGNOSIS — E441 Mild protein-calorie malnutrition: Secondary | ICD-10-CM

## 2024-02-06 DIAGNOSIS — E46 Unspecified protein-calorie malnutrition: Secondary | ICD-10-CM

## 2024-02-06 LAB — CBC
HCT: 31.5 % — ABNORMAL LOW (ref 36.0–46.0)
Hemoglobin: 10.1 g/dL — ABNORMAL LOW (ref 12.0–15.0)
MCH: 30.2 pg (ref 26.0–34.0)
MCHC: 32.1 g/dL (ref 30.0–36.0)
MCV: 94.3 fL (ref 80.0–100.0)
Platelets: 217 K/uL (ref 150–400)
RBC: 3.34 MIL/uL — ABNORMAL LOW (ref 3.87–5.11)
RDW: 13.3 % (ref 11.5–15.5)
WBC: 9.5 K/uL (ref 4.0–10.5)
nRBC: 0 % (ref 0.0–0.2)

## 2024-02-06 LAB — BASIC METABOLIC PANEL WITH GFR
Anion gap: 6 (ref 5–15)
BUN: 20 mg/dL (ref 8–23)
CO2: 27 mmol/L (ref 22–32)
Calcium: 8.2 mg/dL — ABNORMAL LOW (ref 8.9–10.3)
Chloride: 101 mmol/L (ref 98–111)
Creatinine, Ser: 0.56 mg/dL (ref 0.44–1.00)
GFR, Estimated: >60 mL/min (ref >60)
Glucose, Bld: 128 mg/dL — ABNORMAL HIGH (ref 70–99)
Potassium: 4.7 mmol/L (ref 3.5–5.1)
Sodium: 134 mmol/L — ABNORMAL LOW (ref 135–145)

## 2024-02-06 LAB — CK: Total CK: 179 U/L (ref 38–234)

## 2024-02-06 MED ORDER — CALCIUM CARBONATE 1250 (500 CA) MG PO TABS
500.0000 mg | ORAL_TABLET | Freq: Two times a day (BID) | ORAL | Status: DC
  Administered 2024-02-06 – 2024-02-10 (×8): 1250 mg via ORAL
  Filled 2024-02-06 (×9): qty 1

## 2024-02-06 MED ORDER — LACTATED RINGERS IV BOLUS
1000.0000 mL | Freq: Once | INTRAVENOUS | Status: AC
  Administered 2024-02-06: 1000 mL via INTRAVENOUS

## 2024-02-06 MED ORDER — TRANEXAMIC ACID-NACL 1000-0.7 MG/100ML-% IV SOLN
1000.0000 mg | INTRAVENOUS | Status: AC
  Filled 2024-02-06: qty 100

## 2024-02-06 MED ORDER — FE FUM-VIT C-VIT B12-FA 460-60-0.01-1 MG PO CAPS
1.0000 | ORAL_CAPSULE | Freq: Two times a day (BID) | ORAL | Status: DC
  Administered 2024-02-06 – 2024-02-10 (×9): 1 via ORAL
  Filled 2024-02-06 (×10): qty 1

## 2024-02-06 NOTE — Assessment & Plan Note (Signed)
 B/L lung nodules noticed on both apex more on the left, seems chronic with some progress of interstitial changes. -Outpatient follow-up with PCP

## 2024-02-06 NOTE — Evaluation (Signed)
 Physical Therapy Evaluation Patient Details Name: Catherine Scott MRN: [id number] DOB: 1941/04/06 Today's Date: 02/06/2024  History of Present Illness  Pt admitted to [facility] on 02/05/24 for mechanical fall that resulted in R hip fracture. Now s/p R hip IM nailing on 10/17 by Dr. Kayla. Significant PMH includes: COPD, aortic atherosclerosis, hx colon CA s/p colostomy, MDD.  Clinical Impression  Pt received in supine with family at bedside, and is agreeable for PT eval. At baseline, pt is IND with ADL's, IADL's, ambulation without AD and medication management. Family assists with transportation.   Pt presents with RLE weakness secondary to acuity of sx, decreased standing balance, decreased activity tolerance, poor safety awareness, and impulsivity, resulting in impaired functional mobility from baseline. Due to deficits, pt required supervision for bed mobility, CGA-minA+2 for transfers with and without AD, and minA+1 to ambulate 46ft with RW. Due to poor safety awareness and impulsivity she requires max multimodal cues for safety, sequencing, and adherence to RLE PWB (50%) precautions with mobility in RW. Further mobility deferred this date secondary to syncopal episode from hypotension, BP reading 84/39 mmHG.   Deficits limit the pt's ability to safely and independently perform ADL's, transfer, and ambulate. Pt will benefit from acute skilled PT services to address deficits for return to baseline function. Pt will benefit from post acute therapy services to address deficits for return to baseline function.  Encourage OOB mobility with nursing and mobility tech for meals and toileting for continued progress towards goals and maintenance of IND with functional mobility while hospitalized.         If plan is discharge home, recommend the following: A little help with walking and/or transfers;A little help with bathing/dressing/bathroom;Assistance with cooking/housework;Help with stairs or ramp for  entrance;Assist for transportation (if pt elects to DC home, would rec HHPT/OT, assist for all mobility/ADL's)   Can travel by private vehicle   Yes    Equipment Recommendations BSC/3in1        Precautions / Restrictions Precautions Precautions: Fall Recall of Precautions/Restrictions: Impaired Restrictions Weight Bearing Restrictions Per Provider Order: Yes RLE Weight Bearing Per Provider Order: Partial weight bearing RLE Partial Weight Bearing Percentage or Pounds: 50%      Mobility  Bed Mobility Overal bed mobility: Needs Assistance Bed Mobility: Supine to Sit, Sit to Supine     Supine to sit: Supervision Sit to supine: Supervision   General bed mobility comments: supervision for safety to sit EOB, HOB elevated, use of BUE for support, increased time/effort. supervision to lie supine, HOB flat, increased time/effort for BLE facilitation onto bed. totalA+2 for supine scoot towards [facility] for repositioning.    Transfers Overall transfer level: Needs assistance Equipment used: Rolling walker (2 wheels), None Transfers: Sit to/from Stand, Bed to chair/wheelchair/BSC Sit to Stand: Contact guard assist   Step pivot transfers: Min assist, +2 safety/equipment       General transfer comment: CGA to impulsively stand from EOB x2 without AD. CGA for safety to stand from EOB with RW, multimodal cues for safety, sequencing, and hand placement. TotalA to sit on commode due to syncopal episode. minA+2 for SSPT from commode>recliner>EOB requiring max multimodal cues for safety, sequencing, and hand placement.    Ambulation/Gait Ambulation/Gait assistance: Min assist Gait Distance (Feet): 10 Feet Assistive device: Rolling walker (2 wheels)         General Gait Details: minA for balance, RW management, and sequencing cues for gait. Required max multimodal step by step cues for proper sequencing of gait for  proper adherence to PWB 50% on RLE and for widened BOS.      Balance  Overall balance assessment: Needs assistance Sitting-balance support: No upper extremity supported, Feet supported Sitting balance-Leahy Scale: Good     Standing balance support: Bilateral upper extremity supported, During functional activity Standing balance-Leahy Scale: Poor Standing balance comment: minA for standing balance in RW                             Pertinent Vitals/Pain Pain Assessment Pain Assessment: Faces Faces Pain Scale: Hurts a little bit Pain Location: R hip Pain Descriptors / Indicators: Sore Pain Intervention(s): Monitored during session, Repositioned, Limited activity within patient's tolerance    Home Living Family/patient expects to be discharged to:: Private residence Living Arrangements: Alone Available Help at Discharge: Family;Available PRN/intermittently Type of Home: Mobile home Home Access: Stairs to enter Entrance Stairs-Rails: Right;Left;Can reach both Entrance Stairs-Number of Steps: 3   Home Layout: One level Home Equipment: Rollator (4 wheels);Rolling Walker (2 wheels);Cane - single point      Prior Function Prior Level of Function : Independent/Modified Independent;History of Falls (last six months)             Mobility Comments: IND without AD use household distances, 1 fall leading to this hospitlization ADLs Comments: IND with ADLs, daughter assists with IADLs     Extremity/Trunk Assessment   Upper Extremity Assessment Upper Extremity Assessment: Defer to OT evaluation    Lower Extremity Assessment Lower Extremity Assessment: Overall WFL for tasks assessed;RLE deficits/detail RLE Deficits / Details: weakness secondary to acuity of sx       Communication   Communication Communication: No apparent difficulties Factors Affecting Communication: Hearing impaired    Cognition Arousal: Alert Behavior During Therapy: WFL for tasks assessed/performed   PT - Cognitive impairments: Sequencing, Safety/Judgement                          Following commands: Intact       Cueing Cueing Techniques: Verbal cues     General Comments General comments (skin integrity, edema, etc.): syncopal episode after ambulation to toilet, RN notified, BP reading 84/39 mmHg; pt returned to bed.    Exercises Other Exercises Other Exercises: Pt educated re: PT role/POC, DC recommendations, safety with functional mobility, transfer/gait training in RW, RLE PWB 50%.   Assessment/Plan    PT Assessment Patient needs continued PT services  PT Problem List Decreased strength;Decreased activity tolerance;Decreased balance;Decreased mobility;Decreased cognition;Decreased safety awareness;Decreased knowledge of precautions;Cardiopulmonary status limiting activity       PT Treatment Interventions DME instruction;Gait training;Stair training;Functional mobility training;Therapeutic activities;Therapeutic exercise;Balance training;Neuromuscular re-education;Patient/family education    PT Goals (Current goals can be found in the Care Plan section)  Acute Rehab PT Goals Patient Stated Goal: go home PT Goal Formulation: With patient Time For Goal Achievement: 02/20/24 Potential to Achieve Goals: Fair    Frequency Min 2X/week     Co-evaluation PT/OT/SLP Co-Evaluation/Treatment: Yes Reason for Co-Treatment: For patient/therapist safety;To address functional/ADL transfers PT goals addressed during session: Mobility/safety with mobility OT goals addressed during session: ADL's and self-care       AM-PAC PT 6 Clicks Mobility  Outcome Measure Help needed turning from your back to your side while in a flat bed without using bedrails?: A Little Help needed moving from lying on your back to sitting on the side of a flat bed without using bedrails?: A Little Help  needed moving to and from a bed to a chair (including a wheelchair)?: A Little Help needed standing up from a chair using your arms (e.g., wheelchair or  bedside chair)?: A Little Help needed to walk in hospital room?: A Little Help needed climbing 3-5 steps with a railing? : A Lot 6 Click Score: 17    End of Session Equipment Utilized During Treatment: Gait belt Activity Tolerance: Treatment limited secondary to medical complications (Comment) Patient left: in bed;with call bell/phone within reach;with chair alarm set;with nursing/sitter in room Nurse Communication: Mobility status PT Visit Diagnosis: Unsteadiness on feet (R26.81);Muscle weakness (generalized) (M62.81);Difficulty in walking, not elsewhere classified (R26.2)    Time: 8676-8640 PT Time Calculation (min) (ACUTE ONLY): 36 min   Charges:   PT Evaluation $PT Eval High Complexity: 1 High PT Treatments $Gait Training: 8-22 mins PT General Charges $$ ACUTE PT VISIT: 1 Visit        Camie CHARLENA Kluver, PT, DPT 2:58 PM,02/06/24 Physical Therapist -  [facility]

## 2024-02-06 NOTE — Progress Notes (Signed)
 Subjective: 1 Day Post-Op Procedure(s) (LRB): FIXATION, FRACTURE, INTERTROCHANTERIC, WITH INTRAMEDULLARY ROD (Right) Patient is alert and oriented today.  She is comfortable.  Slept some last night she says.  Ate some lunch today. Hemoglobin is 10.1.  Dressing is dry.  Advised patient she will need to go to skilled nursing  Patient reports pain as mild.  Objective:   VITALS:   Vitals:   02/06/24 0741 02/06/24 1346  BP: (!) 116/57 (!) 84/39  Pulse: 70 62  Resp: 17   Temp: 98.1 F (36.7 C)   SpO2: 98%     Neurologically intact Dorsiflexion/Plantar flexion intact Incision: dressing C/D/I  LABS Recent Labs    02/05/24 0842 02/06/24 0416  HGB 12.9 10.1*  HCT 38.8 31.5*  WBC 12.6* 9.5  PLT 241 217    Recent Labs    02/05/24 0842 02/06/24 0416  NA 134* 134*  K 4.1 4.7  BUN 20 20  CREATININE 0.49 0.56  GLUCOSE 147* 128*    Recent Labs    02/05/24 0845  INR 1.0     Assessment/Plan: 1 Day Post-Op Procedure(s) (LRB): FIXATION, FRACTURE, INTERTROCHANTERIC, WITH INTRAMEDULLARY ROD (Right)   Advance diet Up with therapy Discharge to SNF when available. Discharge on 81 mg ASA twice daily for 6 weeks after discharge 50% weightbearing right leg with walker Follow-up in my office 2 weeks after discharge

## 2024-02-06 NOTE — Progress Notes (Signed)
  Progress Note   Patient: Catherine Scott FMW:[id number] DOB: Oct 06, 1940 DOA: 02/05/2024     1 DOS: the patient was seen and examined on 02/06/2024   Brief hospital course: Partly taken from prior notes.  MAKINZI PRIEUR is a 83 y.o. female with medical history significant of COPD, colon cancer status post colectomy and colostomy, presented with mechanical fall and right hip fracture.   On presentation labs and vital stable, x-ray showed displaced comminuted right intertrochanteric fracture of right femur.  Orthopedic surgery was consulted.  10/18: S/p ORIF and right intramedullary rod placement on 02/05/2024.  Hemoglobin decreased to 9.5 this morning likely secondary to fracture and intraoperative blood loss.  Starting on supplement.  CK improving with calcium at 8.2.  Normal vitamin D.  Echocardiogram done yesterday for preoperative risk was with normal EF and grade 1 diastolic dysfunction.  Assessment and Plan: * Hip fracture (HCC) Imaging with right displaced comminuted intertrochanteric femur fracture s/p ORIF and intramedullary nail placement by orthopedic surgery on 02/05/2024. Secondary to mechanical fall. - Continue with supportive care -PT and OT evaluation  Lung nodules B/L lung nodules noticed on both apex more on the left, seems chronic with some progress of interstitial changes. -Outpatient follow-up with PCP  History of COPD No acute concern. -Continue with home bronchodilator  Unspecified protein-calorie malnutrition Estimated body mass index is 21.66 kg/m as calculated from the following:   Height as of 09/16/23: 5' 1 (1.549 m).   Weight as of this encounter: 52 kg.   - Dietitian consult  Depression - Continue home Lexapro   Acute postoperative anemia due to expected blood loss Hemoglobin decreased to 10.1 today, likely secondary to fracture and intraoperative blood loss. - Starting on supplement -Monitor CBC      Subjective: Patient was seen and  examined today.  Still having some right hip pain but stating it is bearable.  Family at bedside.  Physical Exam: Vitals:   02/05/24 1944 02/06/24 0002 02/06/24 0441 02/06/24 0741  BP: 114/64 (!) 98/54 (!) 98/50 (!) 116/57  Pulse: 92 67 67 70  Resp: 17 17 18 17   Temp: 97.6 F (36.4 C) 98.3 F (36.8 C) 98.2 F (36.8 C) 98.1 F (36.7 C)  TempSrc: Oral Oral Oral   SpO2: 96% 97% 100% 98%  Weight:       General.  Frail and malnourished elderly lady, in no acute distress. Pulmonary.  Lungs clear bilaterally, normal respiratory effort. CV.  Regular rate and rhythm, no JVD, rub or murmur. Abdomen.  Soft, nontender, nondistended, BS positive. CNS.  Alert and oriented .  No focal neurologic deficit. Extremities.  No edema, no cyanosis, pulses intact and symmetrical. Psychiatry.  Judgment and insight appears normal.   Data Reviewed: Prior data reviewed  Family Communication: Discussed with cousins at bedside  Disposition: Status is: Inpatient Remains inpatient appropriate because: Severity of illness  Planned Discharge Destination: Skilled nursing facility  DVT prophylaxis.  Lovenox Time spent: 45 minutes  This record has been created using Conservation officer, historic buildings. Errors have been sought and corrected,but may not always be located. Such creation errors do not reflect on the standard of care.   Author: Amaryllis Dare, MD 02/06/2024 12:57 PM  For on call review [email or web address].

## 2024-02-06 NOTE — Hospital Course (Addendum)
 Partly taken from prior notes.  Catherine Scott is a 83 y.o. female with medical history significant of COPD, colon cancer status post colectomy and colostomy, presented with mechanical fall and right hip fracture.   On presentation labs and vital stable, x-ray showed displaced comminuted right intertrochanteric fracture of right femur.  Orthopedic surgery was consulted.  10/18: S/p ORIF and right intramedullary rod placement on 02/05/2024.  Hemoglobin decreased to 9.5 this morning likely secondary to fracture and intraoperative blood loss.  Starting on supplement.  CK improving with calcium at 8.2.  Normal vitamin D.  Echocardiogram done yesterday for preoperative risk was with normal EF and grade 1 diastolic dysfunction.  10/19: Hemodynamically stable, hemoglobin at 9.6. PT is recommending SNF.  10/20: Remained hemodynamically stable, awaiting SNF placement  10/21: Remained hemodynamically stable, [facility].

## 2024-02-06 NOTE — Assessment & Plan Note (Signed)
 Estimated body mass index is 21.66 kg/m as calculated from the following:   Height as of 09/16/23: 5' 1 (1.549 m).   Weight as of this encounter: 52 kg.   - Dietitian consult

## 2024-02-06 NOTE — Assessment & Plan Note (Signed)
 Hemoglobin decreased to 10.1 today, likely secondary to fracture and intraoperative blood loss. - Starting on supplement -Monitor CBC

## 2024-02-06 NOTE — Assessment & Plan Note (Signed)
 No acute concern. -Continue with home bronchodilator

## 2024-02-06 NOTE — Assessment & Plan Note (Signed)
 Imaging with right displaced comminuted intertrochanteric femur fracture s/p ORIF and intramedullary nail placement by orthopedic surgery on 02/05/2024. Secondary to mechanical fall. - Continue with supportive care -PT and OT evaluation

## 2024-02-06 NOTE — Assessment & Plan Note (Signed)
 Continue home Lexapro

## 2024-02-06 NOTE — Evaluation (Signed)
 Occupational Therapy Evaluation Patient Details Name: Catherine Scott MRN: [id number] DOB: [birth year] Today's Date: 02/06/2024   History of Present Illness   Pt admitted to [facility] on 02/05/24 for mechanical fall that resulted in R hip fracture. Now s/p R hip IM nailing on 10/17 by Dr. Kayla. Significant PMH includes: COPD, aortic atherosclerosis, hx colon CA s/p colostomy, MDD.     Clinical Impressions Pt was seen for OT evaluation this date. PTA, pt resides in a one level home alone with 3 STE and bil HR. At baseline, she is IND with ADLs and mobility. Sponge bathes at baseline and daughter assists with IADLs such as grocery shopping. Pt presents with deficits in strength, balance, safety awareness and pain management, affecting safe and optimal ADL completion. Pt currently requires supervision for bed mobility with use of bed features and cues. Pt is impulsive at times, does not like much assist at times and prefers to not utilize RW at times despite education on need for use d/t  PWB status on RLE. She demo ability to stand from EOB without AD with CGA x2 trials impulsively, Min A X1 with constant step by step cues for proper sequencing and technique during ADL transfer/mobility to the bathroom. Pt began sweating and unresponsive requiring total assist to sit on toilet with nurse called in immediately. Pt then urinated in the floor and became responsive with BP of 84/39. She demo peri-care with CGA and Min A for STS from commode, but refused to utilize RW. Min A x2 for SPT to recliner and from recliner back to bed.  Pt would benefit from skilled OT services to address noted impairments and functional limitations to maximize safety and independence while minimizing future risk of falls, injury, and readmission. Do anticipate the need for follow up OT services upon acute hospital DC.       If plan is discharge home, recommend the following:   A lot of help with bathing/dressing/bathroom;A lot  of help with walking and/or transfers;A little help with walking and/or transfers;Assistance with cooking/housework;Help with stairs or ramp for entrance     Functional Status Assessment   Patient has had a recent decline in their functional status and demonstrates the ability to make significant improvements in function in a reasonable and predictable amount of time.     Equipment Recommendations   Other (comment) (defer to next venue)     Recommendations for Other Services         Precautions/Restrictions   Precautions Precautions: Fall Recall of Precautions/Restrictions: Impaired Restrictions Weight Bearing Restrictions Per Provider Order: Yes RLE Weight Bearing Per Provider Order: Partial weight bearing RLE Partial Weight Bearing Percentage or Pounds: 50%     Mobility Bed Mobility Overal bed mobility: Needs Assistance Bed Mobility: Supine to Sit, Sit to Supine     Supine to sit: Supervision, HOB elevated, Used rails Sit to supine: Supervision   General bed mobility comments: increased time/effort; does not like for you to help her at times    Transfers Overall transfer level: Needs assistance Equipment used: Rolling walker (2 wheels), None Transfers: Sit to/from Stand, Bed to chair/wheelchair/BSC Sit to Stand: Contact guard assist     Step pivot transfers: Min assist, +2 safety/equipment     General transfer comment: stands impulsively x2 trials from EOB without AD with CGA, edu on safety and use of RW with mod/max cueing for RW management and sequencing to maintain PWB precautions on RLE during ambulation--Min A x1; total assist to place on  toilet during syncopal episode, Min A to stand once awake; Min A x2 for safety with SPT from commode to recliner and recliner to bed d/t pt refusing to utilize RW      Balance Overall balance assessment: Needs assistance Sitting-balance support: No upper extremity supported, Feet supported Sitting balance-Leahy  Scale: Good     Standing balance support: Bilateral upper extremity supported, During functional activity Standing balance-Leahy Scale: Poor Standing balance comment: minA for standing balance in RW                           ADL either performed or assessed with clinical judgement   ADL Overall ADL's : Needs assistance/impaired                 Upper Body Dressing : Minimal assistance;Sitting Upper Body Dressing Details (indicate cue type and reason): EOB     Toilet Transfer: Minimal assistance;Comfort height toilet;Rolling walker (2 wheels) Toilet Transfer Details (indicate cue type and reason): syncopal episode in bathroom, able to Max A place pt on toilet; required Min A for STS from [facility] over toilet once awake Toileting- Clothing Manipulation and Hygiene: Sitting/lateral lean;Sit to/from stand;Contact guard assist Toileting - Clothing Manipulation Details (indicate cue type and reason): peri-care     Functional mobility during ADLs: Minimal assistance;Rolling walker (2 wheels)       Vision         Perception         Praxis         Pertinent Vitals/Pain Pain Assessment Pain Assessment: Faces Faces Pain Scale: Hurts a little bit Pain Location: R hip Pain Descriptors / Indicators: Sore Pain Intervention(s): Monitored during session, Repositioned, Limited activity within patient's tolerance     Extremity/Trunk Assessment Upper Extremity Assessment Upper Extremity Assessment: Overall WFL for tasks assessed   Lower Extremity Assessment Lower Extremity Assessment: Generalized weakness;RLE deficits/detail RLE Deficits / Details: anticipated weakness s/p surgery       Communication Communication Communication: No apparent difficulties Factors Affecting Communication: Hearing impaired   Cognition Arousal: Alert Behavior During Therapy: Impulsive Cognition: No apparent impairments                               Following commands:  Intact       Cueing  General Comments   Cueing Techniques: Verbal cues  syncopal episode in bathroom after ambulation then urinated in floor; RN called and in immediately to assess, pt only out ~30 seconds and BP 84/39, sweaty, pt did not recall event   Exercises Other Exercises Other Exercises: Edu on role of OT, DC recommendations, safety and fall prevention strategies, use of RW, weight bearing precautions, etc.   Shoulder Instructions      Home Living Family/patient expects to be discharged to:: Private residence Living Arrangements: Alone Available Help at Discharge: Family;Available PRN/intermittently Type of Home: Mobile home Home Access: Stairs to enter Entrance Stairs-Number of Steps: 3 Entrance Stairs-Rails: Right;Left;Can reach both Home Layout: One level     Bathroom Shower/Tub: Tub/shower unit;Sponge bathes at baseline   [employer]: Standard     Home Equipment: Rollator (4 wheels);Rolling Walker (2 wheels);Cane - single point          Prior Functioning/Environment Prior Level of Function : Independent/Modified Independent;History of Falls (last six months)             Mobility Comments: IND without AD use  household distances, 1 fall leading to this hospitlization ADLs Comments: IND with ADLs, daughter assists with IADLs    OT Problem List: Decreased strength;Decreased activity tolerance;Impaired balance (sitting and/or standing);Pain   OT Treatment/Interventions: Self-care/ADL training;Therapeutic exercise;Therapeutic activities;Patient/family education;DME and/or AE instruction;Balance training      OT Goals(Current goals can be found in the care plan section)   Acute Rehab OT Goals Patient Stated Goal: improve strength and function OT Goal Formulation: With patient Time For Goal Achievement: 02/20/24 Potential to Achieve Goals: Good ADL Goals Pt Will Perform Lower Body Bathing: with supervision;sitting/lateral leans;sit to/from  stand Pt Will Perform Lower Body Dressing: with supervision;sitting/lateral leans;sit to/from stand Pt Will Transfer to Toilet: with supervision;ambulating;regular height toilet;bedside commode   OT Frequency:  Min 2X/week    Co-evaluation PT/OT/SLP Co-Evaluation/Treatment: Yes Reason for Co-Treatment: For patient/therapist safety;To address functional/ADL transfers PT goals addressed during session: Mobility/safety with mobility OT goals addressed during session: ADL's and self-care      AM-PAC OT 6 Clicks Daily Activity     Outcome Measure Help from another person eating meals?: None Help from another person taking care of personal grooming?: A Little Help from another person toileting, which includes using toliet, bedpan, or urinal?: A Lot Help from another person bathing (including washing, rinsing, drying)?: A Lot Help from another person to put on and taking off regular upper body clothing?: A Little Help from another person to put on and taking off regular lower body clothing?: A Lot 6 Click Score: 16   End of Session Equipment Utilized During Treatment: Gait belt;Rolling walker (2 wheels) Nurse Communication: Mobility status  Activity Tolerance: Patient tolerated treatment well;Treatment limited secondary to medical complications (Comment) (syncopal episode) Patient left: in bed;with call bell/phone within reach;with bed alarm set;with nursing/sitter in room  OT Visit Diagnosis: Other abnormalities of gait and mobility (R26.89);Muscle weakness (generalized) (M62.81)                Time: 8676-8642 OT Time Calculation (min): 34 min Charges:  OT General Charges $OT Visit: 1 Visit OT Evaluation $OT Eval Moderate Complexity: 1 Mod Artia Singley, OTR/L 02/06/24, 4:18 PM  Deondria Puryear E Davyd Podgorski 02/06/2024, 4:14 PM

## 2024-02-07 DIAGNOSIS — S72001A Fracture of unspecified part of neck of right femur, initial encounter for closed fracture: Secondary | ICD-10-CM | POA: Diagnosis not present

## 2024-02-07 DIAGNOSIS — R918 Other nonspecific abnormal finding of lung field: Secondary | ICD-10-CM | POA: Diagnosis not present

## 2024-02-07 DIAGNOSIS — Z8709 Personal history of other diseases of the respiratory system: Secondary | ICD-10-CM | POA: Diagnosis not present

## 2024-02-07 DIAGNOSIS — E441 Mild protein-calorie malnutrition: Secondary | ICD-10-CM | POA: Diagnosis not present

## 2024-02-07 LAB — CBC
HCT: 30 % — ABNORMAL LOW (ref 36.0–46.0)
Hemoglobin: 9.6 g/dL — ABNORMAL LOW (ref 12.0–15.0)
MCH: 30.7 pg (ref 26.0–34.0)
MCHC: 32 g/dL (ref 30.0–36.0)
MCV: 95.8 fL (ref 80.0–100.0)
Platelets: 186 K/uL (ref 150–400)
RBC: 3.13 MIL/uL — ABNORMAL LOW (ref 3.87–5.11)
RDW: 13.4 % (ref 11.5–15.5)
WBC: 7.8 K/uL (ref 4.0–10.5)
nRBC: 0 % (ref 0.0–0.2)

## 2024-02-07 LAB — BASIC METABOLIC PANEL WITH GFR
Anion gap: 9 (ref 5–15)
BUN: 27 mg/dL — ABNORMAL HIGH (ref 8–23)
CO2: 28 mmol/L (ref 22–32)
Calcium: 8.5 mg/dL — ABNORMAL LOW (ref 8.9–10.3)
Chloride: 101 mmol/L (ref 98–111)
Creatinine, Ser: 0.54 mg/dL (ref 0.44–1.00)
GFR, Estimated: >60 mL/min (ref >60)
Glucose, Bld: 100 mg/dL — ABNORMAL HIGH (ref 70–99)
Potassium: 4.3 mmol/L (ref 3.5–5.1)
Sodium: 138 mmol/L (ref 135–145)

## 2024-02-07 MED ORDER — ENOXAPARIN SODIUM 40 MG/0.4ML IJ SOSY
40.0000 mg | PREFILLED_SYRINGE | INTRAMUSCULAR | Status: DC
  Administered 2024-02-08 – 2024-02-10 (×3): 40 mg via SUBCUTANEOUS
  Filled 2024-02-07 (×3): qty 0.4

## 2024-02-07 NOTE — Progress Notes (Signed)
  Progress Note   Patient: Catherine Scott FMW:[id number] DOB: 1940/10/15 DOA: 02/05/2024     2 DOS: the patient was seen and examined on 02/07/2024   Brief hospital course: Partly taken from prior notes.  LOVA URBIETA is a 83 y.o. female with medical history significant of COPD, colon cancer status post colectomy and colostomy, presented with mechanical fall and right hip fracture.   On presentation labs and vital stable, x-ray showed displaced comminuted right intertrochanteric fracture of right femur.  Orthopedic surgery was consulted.  10/18: S/p ORIF and right intramedullary rod placement on 02/05/2024.  Hemoglobin decreased to 9.5 this morning likely secondary to fracture and intraoperative blood loss.  Starting on supplement.  CK improving with calcium at 8.2.  Normal vitamin D.  Echocardiogram done yesterday for preoperative risk was with normal EF and grade 1 diastolic dysfunction.  10/19: Hemodynamically stable, hemoglobin at 9.6. PT is recommending SNF.  Assessment and Plan: * Hip fracture (HCC) Imaging with right displaced comminuted intertrochanteric femur fracture s/p ORIF and intramedullary nail placement by orthopedic surgery on 02/05/2024. Secondary to mechanical fall. - Continue with supportive care -PT and OT evaluation-recommending SNF  Lung nodules B/L lung nodules noticed on both apex more on the left, seems chronic with some progress of interstitial changes. -Outpatient follow-up with PCP  History of COPD No acute concern. -Continue with home bronchodilator  Unspecified protein-calorie malnutrition Estimated body mass index is 21.66 kg/m as calculated from the following:   Height as of 09/16/23: 5' 1 (1.549 m).   Weight as of this encounter: 52 kg.   - Dietitian consult  Depression - Continue home Lexapro   Acute postoperative anemia due to expected blood loss Hemoglobin decreased to 10.1>.9.6 today, likely secondary to fracture and  intraoperative blood loss. - Started on supplement -Monitor CBC      Subjective: Patient was resting comfortably when seen today.  Pain seems controlled while resting.  No new concern.  Physical Exam: Vitals:   02/06/24 1927 02/06/24 1932 02/07/24 0457 02/07/24 0823  BP:  (!) 110/59 (!) 131/55 (!) 109/59  Pulse:  92 74 72  Resp:  18 16 16   Temp:  98.7 F (37.1 C) 99 F (37.2 C) 97.7 F (36.5 C)  TempSrc:  Oral Oral   SpO2:  95% 96% 96%  Weight:      Height: 5' 1 (1.549 m)      General.  Frail and malnourished elderly lady, in no acute distress. Pulmonary.  Lungs clear bilaterally, normal respiratory effort. CV.  Regular rate and rhythm, no JVD, rub or murmur. Abdomen.  Soft, nontender, nondistended, BS positive. CNS.  Alert and oriented .  No focal neurologic deficit. Extremities.  No edema, no cyanosis, pulses intact and symmetrical. Psychiatry.  Judgment and insight appears normal.    Data Reviewed: Prior data reviewed  Family Communication: Talked with daughter on phone.  Disposition: Status is: Inpatient Remains inpatient appropriate because: Severity of illness  Planned Discharge Destination: Skilled nursing facility  DVT prophylaxis.  Lovenox Time spent: 44 minutes  This record has been created using Conservation officer, historic buildings. Errors have been sought and corrected,but may not always be located. Such creation errors do not reflect on the standard of care.   Author: Amaryllis Dare, MD 02/07/2024 3:43 PM  For on call review [email or web address].

## 2024-02-07 NOTE — Assessment & Plan Note (Signed)
 Hemoglobin decreased to 10.1>.9.6>9.0 today, likely secondary to fracture and intraoperative blood loss. - Started on supplement -Monitor CBC

## 2024-02-07 NOTE — Progress Notes (Signed)
 Subjective: 2 Days Post-Op Procedure(s) (LRB): FIXATION, FRACTURE, INTERTROCHANTERIC, WITH INTRAMEDULLARY ROD (Right) Patient is alert and oriented today.  She feels better with minimal pain.  Great granddaughter is present at the bedside. Hemoglobin is 9.8 today. Dressing is dry and clean and dry. Passive motion of the hip is relatively painless.  Patient reports pain as mild.  Objective:   VITALS:   Vitals:   02/07/24 0457 02/07/24 0823  BP: (!) 131/55 (!) 109/59  Pulse: 74 72  Resp: 16 16  Temp: 99 F (37.2 C) 97.7 F (36.5 C)  SpO2: 96% 96%    Neurologically intact Dorsiflexion/Plantar flexion intact Incision: dressing C/D/I  LABS Recent Labs    02/05/24 0842 02/06/24 0416 02/07/24 0413  HGB 12.9 10.1* 9.6*  HCT 38.8 31.5* 30.0*  WBC 12.6* 9.5 7.8  PLT 241 217 186    Recent Labs    02/05/24 0842 02/06/24 0416 02/07/24 0413  NA 134* 134* 138  K 4.1 4.7 4.3  BUN 20 20 27*  CREATININE 0.49 0.56 0.54  GLUCOSE 147* 128* 100*    Recent Labs    02/05/24 0845  INR 1.0     Assessment/Plan: 2 Days Post-Op Procedure(s) (LRB): FIXATION, FRACTURE, INTERTROCHANTERIC, WITH INTRAMEDULLARY ROD (Right)   Advance diet D/C IV fluids Discharge to SNF when bed available 50% weightbearing right leg with walker ASA 81 mg twice daily for 6 weeks post discharge Return to my office 2 weeks after discharge

## 2024-02-07 NOTE — Plan of Care (Signed)
   Problem: Education: Goal: Knowledge of General Education information will improve Description: Including pain rating scale, medication(s)/side effects and non-pharmacologic comfort measures Outcome: Progressing   Problem: Activity: Goal: Risk for activity intolerance will decrease Outcome: Progressing   Problem: Nutrition: Goal: Adequate nutrition will be maintained Outcome: Progressing

## 2024-02-07 NOTE — Assessment & Plan Note (Signed)
 Imaging with right displaced comminuted intertrochanteric femur fracture s/p ORIF and intramedullary nail placement by orthopedic surgery on 02/05/2024. Secondary to mechanical fall. - Continue with supportive care -PT and OT evaluation-recommending SNF

## 2024-02-07 NOTE — Progress Notes (Signed)
 Physical Therapy Treatment Patient Details Name: Catherine Scott MRN: [id number] DOB: 1940/05/06 Today's Date: 02/07/2024   History of Present Illness Pt admitted to [facility] on 02/05/24 for mechanical fall that resulted in R hip fracture. Now s/p R hip IM nailing on 10/17 by Dr. Kayla. Significant PMH includes: COPD, aortic atherosclerosis, hx colon CA s/p colostomy, MDD.    PT Comments  Patient is agreeable to PT with encouragement. She declined standing today. She required assistance for bed mobility with cues for technique. She was able to sit up for several minutes without loss of balance and no dizziness with seated level activity. Activity tolerance is limited by fatigue. Recommend to continue PT to maximize independence. Consider rehabilitation < 3 hours/day after this hospital stay.    If plan is discharge home, recommend the following: A lot of help with walking and/or transfers;A lot of help with bathing/dressing/bathroom;Assistance with cooking/housework;Assist for transportation;Help with stairs or ramp for entrance   Can travel by private vehicle     No  Equipment Recommendations    TBD   Recommendations for Other Services       Precautions / Restrictions Precautions Precautions: Fall Recall of Precautions/Restrictions: Impaired Restrictions Weight Bearing Restrictions Per Provider Order: Yes RLE Weight Bearing Per Provider Order: Partial weight bearing RLE Partial Weight Bearing Percentage or Pounds: 50     Mobility  Bed Mobility Overal bed mobility: Needs Assistance Bed Mobility: Supine to Sit, Sit to Supine     Supine to sit: Mod assist Sit to supine: Mod assist   General bed mobility comments: assistance for RLE and intermittently for trunk support. increased time and effort with cues for technique and hand placement    Transfers                   General transfer comment: patient declined. not assessed    Ambulation/Gait                    Stairs             Wheelchair Mobility     Tilt Bed    Modified Rankin (Stroke Patients Only)       Balance Overall balance assessment: Needs assistance Sitting-balance support: Single extremity supported Sitting balance-Leahy Scale: Fair                                      Hotel manager: Impaired Factors Affecting Communication: Hearing impaired  Cognition Arousal: Alert Behavior During Therapy: WFL for tasks assessed/performed   PT - Cognitive impairments: Sequencing, Safety/Judgement                         Following commands: Intact (occasional repetition required more due to [facility])      Cueing Cueing Techniques: Verbal cues  Exercises General Exercises - Lower Extremity Long Arc Quad: AROM, Strengthening, Both, 5 reps, Seated    General Comments General comments (skin integrity, edema, etc.): no dizziness reported with upright seated activity today. no increased pain reported      Pertinent Vitals/Pain Pain Assessment Pain Assessment: Faces Faces Pain Scale: Hurts a little bit Pain Location: R hip Pain Descriptors / Indicators: Sore Pain Intervention(s): Limited activity within patient's tolerance, Monitored during session, Repositioned, Premedicated before session (ice pack re-applied at end of session)    Home Living  Prior Function            PT Goals (current goals can now be found in the care plan section) Acute Rehab PT Goals Patient Stated Goal: go home PT Goal Formulation: With patient Time For Goal Achievement: 02/20/24 Potential to Achieve Goals: Fair Progress towards PT goals: Progressing toward goals    Frequency    Min 2X/week      PT Plan      Co-evaluation              AM-PAC PT 6 Clicks Mobility   Outcome Measure  Help needed turning from your back to your side while in a flat bed without using bedrails?: A  Little Help needed moving from lying on your back to sitting on the side of a flat bed without using bedrails?: A Little Help needed moving to and from a bed to a chair (including a wheelchair)?: A Little Help needed standing up from a chair using your arms (e.g., wheelchair or bedside chair)?: A Little Help needed to walk in hospital room?: A Lot Help needed climbing 3-5 steps with a railing? : Total 6 Click Score: 15    End of Session   Activity Tolerance: Patient limited by fatigue Patient left: in bed;with call bell/phone within reach;with bed alarm set;with family/visitor present (ice pack R hip) Nurse Communication: Mobility status PT Visit Diagnosis: Unsteadiness on feet (R26.81);Muscle weakness (generalized) (M62.81);Difficulty in walking, not elsewhere classified (R26.2)     Time: 8947-8884 PT Time Calculation (min) (ACUTE ONLY): 23 min  Charges:    $Therapeutic Activity: 23-37 mins PT General Charges $$ ACUTE PT VISIT: 1 Visit                     Randine Essex, PT, MPT    Randine LULLA Essex 02/07/2024, 1:33 PM

## 2024-02-08 ENCOUNTER — Encounter: Payer: Self-pay | Admitting: Specialist

## 2024-02-08 DIAGNOSIS — Z8709 Personal history of other diseases of the respiratory system: Secondary | ICD-10-CM | POA: Diagnosis not present

## 2024-02-08 DIAGNOSIS — E440 Moderate protein-calorie malnutrition: Secondary | ICD-10-CM | POA: Insufficient documentation

## 2024-02-08 DIAGNOSIS — R918 Other nonspecific abnormal finding of lung field: Secondary | ICD-10-CM | POA: Diagnosis not present

## 2024-02-08 DIAGNOSIS — E441 Mild protein-calorie malnutrition: Secondary | ICD-10-CM | POA: Diagnosis not present

## 2024-02-08 DIAGNOSIS — S72001A Fracture of unspecified part of neck of right femur, initial encounter for closed fracture: Secondary | ICD-10-CM | POA: Diagnosis not present

## 2024-02-08 LAB — CBC
HCT: 28.5 % — ABNORMAL LOW (ref 36.0–46.0)
Hemoglobin: 9 g/dL — ABNORMAL LOW (ref 12.0–15.0)
MCH: 30.8 pg (ref 26.0–34.0)
MCHC: 31.6 g/dL (ref 30.0–36.0)
MCV: 97.6 fL (ref 80.0–100.0)
Platelets: 198 K/uL (ref 150–400)
RBC: 2.92 MIL/uL — ABNORMAL LOW (ref 3.87–5.11)
RDW: 13.4 % (ref 11.5–15.5)
WBC: 7 K/uL (ref 4.0–10.5)
nRBC: 0 % (ref 0.0–0.2)

## 2024-02-08 LAB — BASIC METABOLIC PANEL WITH GFR
Anion gap: 8 (ref 5–15)
BUN: 26 mg/dL — ABNORMAL HIGH (ref 8–23)
CO2: 29 mmol/L (ref 22–32)
Calcium: 8.3 mg/dL — ABNORMAL LOW (ref 8.9–10.3)
Chloride: 103 mmol/L (ref 98–111)
Creatinine, Ser: 0.53 mg/dL (ref 0.44–1.00)
GFR, Estimated: >60 mL/min (ref >60)
Glucose, Bld: 97 mg/dL (ref 70–99)
Potassium: 4.7 mmol/L (ref 3.5–5.1)
Sodium: 140 mmol/L (ref 135–145)

## 2024-02-08 NOTE — Care Management Important Message (Signed)
 Important Message  Patient Details  Name: Catherine Scott MRN: [id number] Date of Birth: 11-25-1940   Important Message Given:  Yes - Medicare IM     Ajanee Buren W, CMA 02/08/2024, 12:47 PM

## 2024-02-08 NOTE — Progress Notes (Signed)
 Subjective: 3 Days Post-Op Procedure(s) (LRB): FIXATION, FRACTURE, INTERTROCHANTERIC, WITH INTRAMEDULLARY ROD (Right) Patient appears well and is oriented.  She has little pain.  Made more progress with PT today. Hemoglobin is 9.0. Vital signs are normal. She will need skilled nursing placement.  Patient reports pain as mild.  Objective:   VITALS:   Vitals:   02/08/24 0427 02/08/24 0824  BP: (!) 120/54 (!) 136/58  Pulse: 71 78  Resp: 17 17  Temp: 98.6 F (37 C) 97.8 F (36.6 C)  SpO2: 99% 98%    Neurologically intact Dorsiflexion/Plantar flexion intact Incision: dressing C/D/I  LABS Recent Labs    02/06/24 0416 02/07/24 0413 02/08/24 0413  HGB 10.1* 9.6* 9.0*  HCT 31.5* 30.0* 28.5*  WBC 9.5 7.8 7.0  PLT 217 186 198    Recent Labs    02/06/24 0416 02/07/24 0413 02/08/24 0413  NA 134* 138 140  K 4.7 4.3 4.7  BUN 20 27* 26*  CREATININE 0.56 0.54 0.53  GLUCOSE 128* 100* 97    No results for input(s): LABPT, INR in the last 72 hours.   Assessment/Plan: 3 Days Post-Op Procedure(s) (LRB): FIXATION, FRACTURE, INTERTROCHANTERIC, WITH INTRAMEDULLARY ROD (Right)   Advance diet Up with therapy Discharge to SNF

## 2024-02-08 NOTE — Progress Notes (Signed)
 Nutrition Follow-up  DOCUMENTATION CODES:   Non-severe (moderate) malnutrition in context of chronic illness  INTERVENTION:   -Continue MVI with minerals daily -Continue Ensure Plus High Protein po BID, each supplement provides 350 kcal and 20 grams of protein  -Continue regular diet  NUTRITION DIAGNOSIS:   Moderate Malnutrition related to chronic illness (COPD) as evidenced by mild fat depletion, moderate fat depletion, mild muscle depletion, moderate muscle depletion.  Ongoing  GOAL:   Patient will meet greater than or equal to 90% of their needs  Progressing   MONITOR:   PO intake, Supplement acceptance, Diet advancement  REASON FOR ASSESSMENT:   Consult Assessment of nutrition requirement/status, Hip fracture protocol  ASSESSMENT:   PMH of COPD, colon cancer status post colectomy and colostomy, presented with fall and right hip fracture.  10/17- s/p PROCEDURE:  FIXATION, FRACTURE, right INTERTROCHANTERIC, WITH INTRAMEDULLARY ROD   Reviewed I/O's: -1 L x 24 hours and -525 ml since admission  UOP: 1.3 L x 24 hours  Spoke with patient at bedside, who was pleasant and in good spirits at time of visit. She was hard of hearing and it was difficult for her to provide history. She was eating lunch at time of visit. She shares that she has a good appetite PTA and generally consumes 3 meals per day (TV dinners). Noted meal completions 40-95%.   No weight loss noted over the past 4 months. Per reviewed of weight history, UBW is around 112#. She denies any weight loss.   Per orthopedics notes, plan for SNF at discharge.   Medications reviewed and include calcium carbonate, lexparo, neurontin , and senokot.   Labs reviewed.    NUTRITION - FOCUSED PHYSICAL EXAM:  Flowsheet Row Most Recent Value  Orbital Region Moderate depletion  Upper Arm Region Moderate depletion  Thoracic and Lumbar Region Mild depletion  Buccal Region Mild depletion  Temple Region Moderate  depletion  Clavicle Bone Region Severe depletion  Clavicle and Acromion Bone Region Severe depletion  Scapular Bone Region Severe depletion  Dorsal Hand Moderate depletion  Patellar Region Mild depletion  Anterior Thigh Region Mild depletion  Posterior Calf Region Mild depletion  Edema (RD Assessment) None  Hair Reviewed  Eyes Reviewed  Mouth Reviewed  Skin Reviewed  Nails Reviewed    Diet Order:   Diet Order             Diet regular Room service appropriate? Yes; Fluid consistency: Thin  Diet effective now                   EDUCATION NEEDS:   Education needs have been addressed  Skin:  Skin Assessment: Skin Integrity Issues: Skin Integrity Issues:: Incisions Incisions: closed rt hip  Last BM:  02/08/24 (via colostomy)  Height:   Ht Readings from Last 1 Encounters:  02/06/24 5' 1 (1.549 m)    Weight:   Wt Readings from Last 1 Encounters:  02/05/24 52 kg    Ideal Body Weight:  47.7 kg  BMI:  Body mass index is 21.66 kg/m.  Estimated Nutritional Needs:   Kcal:  1550-1750  Protein:  80-95 grams  Fluid:  1.5-1.7 L    Margery ORN, RD, LDN, CDCES Registered Dietitian III Certified Diabetes Care and Education Specialist If unable to reach this RD, please use RD Inpatient group chat on secure chat between hours of 8am-4 pm daily

## 2024-02-08 NOTE — Progress Notes (Signed)
  Progress Note   Patient: Catherine Scott FMW:[id number] DOB: 1940/06/02 DOA: 02/05/2024     3 DOS: the patient was seen and examined on 02/08/2024   Brief hospital course: Partly taken from prior notes.  CRIS GIBBY is a 83 y.o. female with medical history significant of COPD, colon cancer status post colectomy and colostomy, presented with mechanical fall and right hip fracture.   On presentation labs and vital stable, x-ray showed displaced comminuted right intertrochanteric fracture of right femur.  Orthopedic surgery was consulted.  10/18: S/p ORIF and right intramedullary rod placement on 02/05/2024.  Hemoglobin decreased to 9.5 this morning likely secondary to fracture and intraoperative blood loss.  Starting on supplement.  CK improving with calcium at 8.2.  Normal vitamin D.  Echocardiogram done yesterday for preoperative risk was with normal EF and grade 1 diastolic dysfunction.  10/19: Hemodynamically stable, hemoglobin at 9.6. PT is recommending SNF.  10/20: Remained hemodynamically stable, awaiting SNF placement  Assessment and Plan: * Hip fracture (HCC) Imaging with right displaced comminuted intertrochanteric femur fracture s/p ORIF and intramedullary nail placement by orthopedic surgery on 02/05/2024. Secondary to mechanical fall. - Continue with supportive care -PT and OT evaluation-recommending SNF  Lung nodules B/L lung nodules noticed on both apex more on the left, seems chronic with some progress of interstitial changes. -Outpatient follow-up with PCP  History of COPD No acute concern. -Continue with home bronchodilator  Unspecified protein-calorie malnutrition Estimated body mass index is 21.66 kg/m as calculated from the following:   Height as of this encounter: 5' 1 (1.549 m).   Weight as of this encounter: 52 kg.   - Dietitian consult - Patient has moderate malnutrition  Depression - Continue home Lexapro   Acute postoperative anemia  due to expected blood loss Hemoglobin decreased to 10.1>.9.6>9.0 today, likely secondary to fracture and intraoperative blood loss. - Started on supplement -Monitor CBC      Subjective: Patient was seen and examined today.  No new concern.  Physical Exam: Vitals:   02/07/24 2133 02/08/24 0427 02/08/24 0824 02/08/24 1615  BP: (!) 103/50 (!) 120/54 (!) 136/58 (!) 153/63  Pulse: 72 71 78 84  Resp:  17 17 17   Temp:  98.6 F (37 C) 97.8 F (36.6 C) 98.3 F (36.8 C)  TempSrc:  Oral    SpO2:  99% 98% 99%  Weight:      Height:       General.  Malnourished and frail elderly lady, in no acute distress. Pulmonary.  Lungs clear bilaterally, normal respiratory effort. CV.  Regular rate and rhythm, no JVD, rub or murmur. Abdomen.  Soft, nontender, nondistended, BS positive. CNS.  Alert and oriented .  No focal neurologic deficit. Extremities.  No edema, no cyanosis, pulses intact and symmetrical.   Data Reviewed: Prior data reviewed  Family Communication:   Disposition: Status is: Inpatient Remains inpatient appropriate because: Severity of illness  Planned Discharge Destination: Skilled nursing facility  DVT prophylaxis.  Lovenox Time spent: 43 minutes  This record has been created using Conservation officer, historic buildings. Errors have been sought and corrected,but may not always be located. Such creation errors do not reflect on the standard of care.   Author: Amaryllis Dare, MD 02/08/2024 4:56 PM  For on call review [email or web address].

## 2024-02-08 NOTE — Progress Notes (Signed)
 Physical Therapy Treatment Patient Details Name: Catherine Scott MRN: [id number] DOB: 03-12-1941 Today's Date: 02/08/2024   History of Present Illness Pt admitted to [facility] on 02/05/24 for mechanical fall that resulted in R hip fracture. Now s/p R hip IM nailing on 10/17 by Dr. Kayla. Significant PMH includes: COPD, aortic atherosclerosis, hx colon CA s/p colostomy, MDD.    PT Comments  Pt alert and agreeable to participate in PT tx this date. Pt was met semi-supine in bed, modA trunk/BLE assist for bed mobility with mod VC for sequencing and bed rail assist. Pt performed 3 STS from EOB with minAx2 for first 2 attempts and modA for final attempt. Max multimodal cues for hand placement on RW during transition to standing. Attempted to have pt take side-steps toward [facility], able to take 1 step with RLE before needing to sit back down, pt demonstrated difficulty comprehending step sequencing for side-steps. Pt able to laterally scoot hips toward [facility] with supervision. Pt was left semi-supine in bed at end of session, all needs in reach. The patient would benefit from further skilled PT intervention to continue to progress towards goals.       If plan is discharge home, recommend the following: A lot of help with walking and/or transfers;A lot of help with bathing/dressing/bathroom;Assistance with cooking/housework;Assist for transportation;Help with stairs or ramp for entrance   Can travel by private vehicle     No  Equipment Recommendations  Other (comment) (TBD at next venue of care)    Recommendations for Other Services       Precautions / Restrictions Precautions Precautions: Fall Recall of Precautions/Restrictions: Impaired Restrictions Weight Bearing Restrictions Per Provider Order: Yes RLE Weight Bearing Per Provider Order: Partial weight bearing RLE Partial Weight Bearing Percentage or Pounds: 50     Mobility  Bed Mobility Overal bed mobility: Needs Assistance Bed Mobility:  Supine to Sit, Sit to Supine     Supine to sit: Mod assist, HOB elevated Sit to supine: Mod assist   General bed mobility comments: Assistance for BLE and trunk for bed mobility, max VC for sequencing inc time/effort    Transfers Overall transfer level: Needs assistance Equipment used: Rolling walker (2 wheels) Transfers: Sit to/from Stand, Bed to chair/wheelchair/BSC Sit to Stand: Min assist, Mod assist, +2 physical assistance          Lateral/Scoot Transfers: Supervision General transfer comment: 3 STS from EOB, 2 with minAx2 and one with modA. VC for hand placement throughout. Pt able to laterally scoot hips up toward [facility] with supervision.    Ambulation/Gait Ambulation/Gait assistance: Min assist Gait Distance (Feet): 1 Feet Assistive device: Rolling walker (2 wheels) Gait Pattern/deviations: Step-to pattern, Shuffle, Narrow base of support       General Gait Details: attempted to have pt side-step toward [facility], able to take one step with RLE before needing to sit back down. Pt with difficulty processing step sequencing   Stairs             Wheelchair Mobility     Tilt Bed    Modified Rankin (Stroke Patients Only)       Balance Overall balance assessment: Needs assistance Sitting-balance support: Feet supported, Single extremity supported Sitting balance-Leahy Scale: Fair Sitting balance - Comments: requires at least unilateral UE support to maintain midline   Standing balance support: Bilateral upper extremity supported, Reliant on assistive device for balance Standing balance-Leahy Scale: Poor Standing balance comment: minA for standing, heavy BUE support on RW  Communication Communication Communication: Impaired Factors Affecting Communication: Hearing impaired  Cognition Arousal: Alert Behavior During Therapy: WFL for tasks assessed/performed   PT - Cognitive impairments: Sequencing                          Following commands: Intact (occasional repetition of commands due to hearing deficits)      Cueing Cueing Techniques: Verbal cues  Exercises General Exercises - Lower Extremity Long Arc Quad: AROM, Both, 10 reps, Seated Hip Flexion/Marching: AROM, Both, 10 reps, Seated Toe Raises: AROM, Both, 10 reps, Seated    General Comments        Pertinent Vitals/Pain Pain Assessment Pain Assessment: Faces Faces Pain Scale: Hurts a little bit Pain Location: R hip Pain Descriptors / Indicators: Sore Pain Intervention(s): Monitored during session, Repositioned, Ice applied    Home Living                          Prior Function            PT Goals (current goals can now be found in the care plan section) Progress towards PT goals: Progressing toward goals    Frequency    Min 2X/week      PT Plan      Co-evaluation              AM-PAC PT 6 Clicks Mobility   Outcome Measure  Help needed turning from your back to your side while in a flat bed without using bedrails?: A Little Help needed moving from lying on your back to sitting on the side of a flat bed without using bedrails?: A Lot Help needed moving to and from a bed to a chair (including a wheelchair)?: A Lot Help needed standing up from a chair using your arms (e.g., wheelchair or bedside chair)?: A Lot Help needed to walk in hospital room?: A Lot Help needed climbing 3-5 steps with a railing? : Total 6 Click Score: 12    End of Session   Activity Tolerance: Patient limited by fatigue Patient left: in bed;with call bell/phone within reach;with bed alarm set (ice pack applied to R hip) Nurse Communication: Mobility status PT Visit Diagnosis: Unsteadiness on feet (R26.81);Muscle weakness (generalized) (M62.81);Difficulty in walking, not elsewhere classified (R26.2)     Time: 8958-8897 PT Time Calculation (min) (ACUTE ONLY): 21 min  Charges:    $Therapeutic Activity: 8-22 mins PT  General Charges $$ ACUTE PT VISIT: 1 Visit                     Alize Borrayo, SPT

## 2024-02-08 NOTE — Assessment & Plan Note (Signed)
 Estimated body mass index is 21.66 kg/m as calculated from the following:   Height as of this encounter: 5' 1 (1.549 m).   Weight as of this encounter: 52 kg.   - Dietitian consult - Patient has moderate malnutrition

## 2024-02-09 DIAGNOSIS — L603 Nail dystrophy: Secondary | ICD-10-CM | POA: Diagnosis not present

## 2024-02-09 DIAGNOSIS — Z8709 Personal history of other diseases of the respiratory system: Secondary | ICD-10-CM | POA: Diagnosis not present

## 2024-02-09 DIAGNOSIS — E441 Mild protein-calorie malnutrition: Secondary | ICD-10-CM | POA: Diagnosis not present

## 2024-02-09 DIAGNOSIS — R918 Other nonspecific abnormal finding of lung field: Secondary | ICD-10-CM | POA: Diagnosis not present

## 2024-02-09 DIAGNOSIS — S72001A Fracture of unspecified part of neck of right femur, initial encounter for closed fracture: Secondary | ICD-10-CM | POA: Diagnosis not present

## 2024-02-09 NOTE — TOC Initial Note (Signed)
 Transition of Care [facility]) - Initial/Assessment Note    Patient Details  Name: Catherine Scott MRN: [id number] Date of Birth: Sep 12, 1940  [facility] CM/SW Contact:    Alvaro Louder, LCSW Phone Number: 02/09/2024, 9:00 AM  Clinical Narrative:    Per Chart review patient from home PCP is Toribio Hoyle LCSWA Faxed out information to SNF's in [place]. LCSWA will present Facilities to patient at the bedside.              TOC to follow for discharge       Patient Goals and CMS Choice            Expected Discharge Plan and Services         Expected Discharge Date: 02/09/24                                    Prior Living Arrangements/Services                       Activities of Daily Living   ADL Screening (condition at time of admission) Independently performs ADLs?: No (Prior to fall she would independently perform ADLs. Now will probably need PT and OT consult evaluation and treatment.) Does the patient have a NEW difficulty with bathing/dressing/toileting/self-feeding that is expected to last >3 days?: Yes (Initiates electronic notice to provider for possible OT consult) (Right Hip surgery) Does the patient have a NEW difficulty with getting in/out of bed, walking, or climbing stairs that is expected to last >3 days?: Yes (Initiates electronic notice to provider for possible PT consult) (Right Hip Surgery) Does the patient have a NEW difficulty with communication that is expected to last >3 days?: No Is the patient deaf or have difficulty hearing?: Yes Does the patient have difficulty seeing, even when wearing glasses/contacts?: No Does the patient have difficulty concentrating, remembering, or making decisions?: No  Permission Sought/Granted                  Emotional Assessment              Admission diagnosis:  Hip fracture (HCC) [S72.009A] Fall, initial encounter [W19.XXXA] Closed fracture of right hip, initial  encounter [facility]) [S72.001A] Patient Active Problem List   Diagnosis Date Noted   Malnutrition of moderate degree 02/08/2024   Lung nodules 02/06/2024   History of COPD 02/06/2024   Unspecified protein-calorie malnutrition 02/06/2024   Depression 02/06/2024   Acute postoperative anemia due to expected blood loss 02/06/2024   Hip fracture, unspecified laterality, closed, initial encounter (HCC) 02/05/2024   Hip fracture (HCC) 02/05/2024   Recurrent major depressive disorder, in remission 09/16/2023   Tobacco use disorder 09/26/2022   COPD (chronic obstructive pulmonary disease) (HCC) 09/26/2022   Aortic atherosclerosis 09/26/2022   History of colon cancer 09/26/2022   Colostomy status (HCC) 09/26/2022   Seborrheic keratoses 09/26/2022   PCP:  Hoyle Toribio SHAUNNA, PA Pharmacy:   [facility] [phone] [address], [place] - [address] [place] [address] Phone: [phone] Fax: [phone]     Social Drivers of Health (SDOH) Social History: SDOH Screenings   Food Insecurity: No Food Insecurity (02/05/2024)  Housing: Low Risk  (02/05/2024)  Transportation Needs: Unmet Transportation Needs (02/05/2024)  Utilities: Not At Risk (02/05/2024)  Depression (PHQ2-9): Medium Risk (09/16/2023)  Social Connections: Socially Isolated (02/05/2024)  Tobacco Use: High Risk (02/05/2024)  SDOH Interventions:     Readmission Risk Interventions     No data to display

## 2024-02-09 NOTE — [place] FL2 (Signed)
 [place]  MEDICAID FL2 LEVEL OF CARE FORM     IDENTIFICATION  Patient Name: Catherine Scott Birthdate: 1940/11/08 Sex: female Admission Date (Current Location): 02/05/2024  [facility] and [place] Number:  Chiropodist and Address:  [facility], [address] [place], [place] [address]      Provider Number: [id number]  Attending Physician Name and Address:  Caleen Qualia, MD  Relative Name and Phone Number:       Current Level of Care: Hospital Recommended Level of Care: Skilled Nursing Facility Prior Approval Number:    Date Approved/Denied:   PASRR Number: [id number]  Discharge Plan: SNF    Current Diagnoses: Patient Active Problem List   Diagnosis Date Noted   Malnutrition of moderate degree 02/08/2024   Lung nodules 02/06/2024   History of COPD 02/06/2024   Unspecified protein-calorie malnutrition 02/06/2024   Depression 02/06/2024   Acute postoperative anemia due to expected blood loss 02/06/2024   Hip fracture, unspecified laterality, closed, initial encounter (HCC) 02/05/2024   Hip fracture (HCC) 02/05/2024   Recurrent major depressive disorder, in remission 09/16/2023   Tobacco use disorder 09/26/2022   COPD (chronic obstructive pulmonary disease) (HCC) 09/26/2022   Aortic atherosclerosis 09/26/2022   History of colon cancer 09/26/2022   Colostomy status (HCC) 09/26/2022   Seborrheic keratoses 09/26/2022    Orientation RESPIRATION BLADDER Height & Weight     Self, Time, Situation, Place  Normal Incontinent Weight: 114 lb 10.2 oz (52 kg) Height:  5' 1 (154.9 cm)  BEHAVIORAL SYMPTOMS/MOOD NEUROLOGICAL BOWEL NUTRITION STATUS      Continent Diet  AMBULATORY STATUS COMMUNICATION OF NEEDS Skin   Limited Assist Verbally Surgical wounds (Surgical closed incision Right Hip)                       Personal Care Assistance Level of Assistance  Feeding, Bathing, Dressing Bathing Assistance: Limited assistance Feeding  assistance: Independent Dressing Assistance: Limited assistance     Functional Limitations Info  Hearing, Sight, Speech Sight Info: Adequate Hearing Info: Impaired Speech Info: Adequate    SPECIAL CARE FACTORS FREQUENCY  PT (By licensed PT), OT (By licensed OT)     PT Frequency: 5x/week OT Frequency: 5x/week            Contractures      Additional Factors Info  Code Status Code Status Info: Full             Current Medications (02/09/2024):  This is the current hospital active medication list Current Facility-Administered Medications  Medication Dose Route Frequency Provider Last Rate Last Admin   acetaminophen (TYLENOL) tablet 325-650 mg  325-650 mg Oral Q6H PRN Cleotilde Barrio, MD   650 mg at 02/07/24 0626   alum & mag hydroxide-simeth (MAALOX/MYLANTA) 200-200-20 MG/5ML suspension 30 mL  30 mL Oral Q4H PRN Cleotilde Barrio, MD       bisacodyl (DULCOLAX) EC tablet 5 mg  5 mg Oral Daily PRN Cleotilde Barrio, MD       bisacodyl (DULCOLAX) suppository 10 mg  10 mg Rectal Daily PRN Cleotilde Barrio, MD       calcium carbonate (OS-CAL - dosed in mg of elemental calcium) tablet 1,250 mg  500 mg of elemental calcium Oral BID WC Amin, Sumayya, MD   1,250 mg at 02/09/24 0827   enoxaparin (LOVENOX) injection 40 mg  40 mg Subcutaneous Q24H Nazari, Walid A, RPH   40 mg at 02/09/24 0826   escitalopram  (LEXAPRO ) tablet 10  mg  10 mg Oral Daily Cleotilde Barrio, MD   10 mg at 02/09/24 0827   Fe Fum-Vit C-Vit B12-FA (TRIGELS-F FORTE) capsule 1 capsule  1 capsule Oral BID Amin, Sumayya, MD   1 capsule at 02/09/24 0827   feeding supplement (ENSURE PLUS HIGH PROTEIN) liquid 237 mL  237 mL Oral BID BM Cleotilde Barrio, MD   237 mL at 02/09/24 0828   fentaNYL (SUBLIMAZE) injection 25-50 mcg  25-50 mcg Intravenous Q5 min PRN Cleotilde Barrio, MD       fluticasone furoate-vilanterol (BREO ELLIPTA) 100-25 MCG/ACT 1 puff  1 puff Inhalation Daily Cleotilde Barrio, MD   1 puff at 02/09/24 0827   gabapentin   (NEURONTIN ) capsule 200 mg  200 mg Oral BID Cleotilde Barrio, MD   200 mg at 02/09/24 0826   HYDROcodone-acetaminophen (NORCO) 7.5-325 MG per tablet 1-2 tablet  1-2 tablet Oral Q4H PRN Cleotilde Barrio, MD       HYDROcodone-acetaminophen (NORCO/VICODIN) 5-325 MG per tablet 1-2 tablet  1-2 tablet Oral Q4H PRN Cleotilde Barrio, MD   1 tablet at 02/09/24 0423   Influenza vac split trivalent PF (FLUZONE HIGH-DOSE) injection 0.5 mL  0.5 mL Intramuscular Tomorrow-1000 Laurita Manor T, MD       magnesium hydroxide (MILK OF MAGNESIA) suspension 30 mL  30 mL Oral Daily PRN Cleotilde Barrio, MD       menthol (CEPACOL) lozenge 3 mg  1 lozenge Oral PRN Cleotilde Barrio, MD       Or   phenol (CHLORASEPTIC) mouth spray 1 spray  1 spray Mouth/Throat PRN Cleotilde Barrio, MD       metoCLOPramide (REGLAN) tablet 5-10 mg  5-10 mg Oral Q8H PRN Cleotilde Barrio, MD       Or   metoCLOPramide (REGLAN) injection 5-10 mg  5-10 mg Intravenous Q8H PRN Cleotilde Barrio, MD       metoprolol tartrate (LOPRESSOR) tablet 12.5 mg  12.5 mg Oral BID Cleotilde Barrio, MD   12.5 mg at 02/08/24 2124   morphine (PF) 2 MG/ML injection 0.5-1 mg  0.5-1 mg Intravenous Q2H PRN Cleotilde Barrio, MD       multivitamin with minerals tablet 1 tablet  1 tablet Oral Daily Cleotilde Barrio, MD   1 tablet at 02/09/24 0827   ondansetron (ZOFRAN) tablet 4 mg  4 mg Oral Q6H PRN Cleotilde Barrio, MD       Or   ondansetron (ZOFRAN) injection 4 mg  4 mg Intravenous Q6H PRN Cleotilde Barrio, MD       senna (SENOKOT) tablet 8.6 mg  1 tablet Oral BID Cleotilde Barrio, MD   8.6 mg at 02/09/24 0827   sodium phosphate (FLEET) enema 1 enema  1 enema Rectal Once PRN Cleotilde Barrio, MD       zolpidem KAYLA) tablet 5 mg  5 mg Oral QHS PRN Cleotilde Barrio, MD         Discharge Medications: Please see discharge summary for a list of discharge medications.  Relevant Imaging Results:  Relevant Lab Results:   Additional Information SSN: [id number]  Catherine Scott  [place], LCSW

## 2024-02-09 NOTE — Progress Notes (Signed)
  Progress Note   Patient: Catherine Scott FMW:[id number] DOB: [birth year] DOA: 02/05/2024     4 DOS: the patient was seen and examined on 02/09/2024   Brief hospital course: Partly taken from prior notes.  Catherine Scott is a 83 y.o. female with medical history significant of COPD, colon cancer status post colectomy and colostomy, presented with mechanical fall and right hip fracture.   On presentation labs and vital stable, x-ray showed displaced comminuted right intertrochanteric fracture of right femur.  Orthopedic surgery was consulted.  10/18: S/p ORIF and right intramedullary rod placement on 02/05/2024.  Hemoglobin decreased to 9.5 this morning likely secondary to fracture and intraoperative blood loss.  Starting on supplement.  CK improving with calcium at 8.2.  Normal vitamin D.  Echocardiogram done yesterday for preoperative risk was with normal EF and grade 1 diastolic dysfunction.  10/19: Hemodynamically stable, hemoglobin at 9.6. PT is recommending SNF.  10/20: Remained hemodynamically stable, awaiting SNF placement  10/21: Remained hemodynamically stable, hoping [facility].  Assessment and Plan: * Hip fracture (HCC) Imaging with right displaced comminuted intertrochanteric femur fracture s/p ORIF and intramedullary nail placement by orthopedic surgery on 02/05/2024. Secondary to mechanical fall. - Continue with supportive care -PT and OT evaluation-recommending SNF  Lung nodules B/L lung nodules noticed on both apex more on the left, seems chronic with some progress of interstitial changes. -Outpatient follow-up with PCP  History of COPD No acute concern. -Continue with home bronchodilator  Unspecified protein-calorie malnutrition Estimated body mass index is 21.66 kg/m as calculated from the following:   Height as of this encounter: 5' 1 (1.549 m).   Weight as of this encounter: 52 kg.   - Dietitian consult - Patient has moderate  malnutrition  Depression - Continue home Lexapro   Acute postoperative anemia due to expected blood loss Hemoglobin decreased to 10.1>.9.6>9.0 today, likely secondary to fracture and intraoperative blood loss. - Started on supplement -Monitor CBC      Subjective: Patient was resting comfortably in bed when seen today.  Having some pain at surgical site which increases with moving the leg.  Physical Exam: Vitals:   02/08/24 1615 02/08/24 2010 02/09/24 0408 02/09/24 1537  BP: (!) 153/63 122/78 125/62 (!) 119/57  Pulse: 84 78 81 75  Resp: 17 18 16 17   Temp: 98.3 F (36.8 C) 98.2 F (36.8 C) 98.2 F (36.8 C) 98 F (36.7 C)  TempSrc:  Oral Oral   SpO2: 99% 100% 100% 99%  Weight:      Height:       General.  Frail and malnourished lady, in no acute distress. Pulmonary.  Lungs clear bilaterally, normal respiratory effort. CV.  Regular rate and rhythm, no JVD, rub or murmur. Abdomen.  Soft, nontender, nondistended, BS positive. CNS.  Alert and oriented .  No focal neurologic deficit. Extremities.  No edema, no cyanosis, pulses intact and symmetrical.   Data Reviewed: Prior data reviewed  Family Communication: Talked with daughter on phone.  Disposition: Status is: Inpatient Remains inpatient appropriate because: Severity of illness  Planned Discharge Destination: Skilled nursing facility  DVT prophylaxis.  Lovenox Time spent: 42 minutes  This record has been created using Conservation officer, historic buildings. Errors have been sought and corrected,but may not always be located. Such creation errors do not reflect on the standard of care.   Author: Amaryllis Dare, MD 02/09/2024 4:40 PM  For on call review [email or web address].

## 2024-02-09 NOTE — TOC Progression Note (Signed)
 Transition of Care [facility]) - Progression Note    Patient Details  Name: Catherine Scott MRN: [id number] Date of Birth: 04-10-1941  [facility]) CM/SW Contact  Alvaro Louder, [place] Phone Number: 02/09/2024, 4:21 PM  Clinical Narrative:  [facility] approved for patient to admit to [facility]. LCSWA to check for bed availability tomorrow  Approved [id number] Dates: 10/21-10/23/2025 Next Review Date: 02/11/2024   [facility] to follow for discharge                    Expected Discharge Plan and Services         Expected Discharge Date: 02/09/24                                     Social Drivers of Health (SDOH) Interventions SDOH Screenings   Food Insecurity: No Food Insecurity (02/05/2024)  Housing: Low Risk  (02/05/2024)  Transportation Needs: Unmet Transportation Needs (02/05/2024)  Utilities: Not At Risk (02/05/2024)  Depression (PHQ2-9): Medium Risk (09/16/2023)  Social Connections: Socially Isolated (02/05/2024)  Tobacco Use: High Risk (02/05/2024)    Readmission Risk Interventions     No data to display

## 2024-02-09 NOTE — Consult Note (Signed)
  Subjective:  Patient ID: Catherine Scott, female    DOB: 04-19-1941,  MRN: [id number]  Chief Complaint  Patient presents with   Fall    83 y.o. female presents with the above complaint. History confirmed with patient. Had RIGHT hip fx repair with ortho this admission. Dr. Cleotilde requested to see if podiatry would be able to assist with dystrophic elongated nails causing pain. Patient presenting with pain related to dystrophic thickened elongated nails. Patient is unable to trim own nails related to nail dystrophy and/or mobility issues. Patient does not have a history of T2DM.  She reports pain  due to long nails.   Objective:  Physical Exam: warm, good capillary refill nail exam onychomycosis of the toenails and severe elongation of multiple nails 2-5 bilateral foot DP pulses palpable, PT pulses palpable, and protective sensation intact Left Foot:  Pain with palpation of nails due to elongation and dystrophic growth.  Right Foot: Pain with palpation of nails due to elongation and dystrophic growth.   Assessment:   1. Fall, initial encounter   2. Closed fracture of right hip, initial encounter [facility])      Plan:  Patient was evaluated and treated and all questions answered.  #Onychomycosis and severe dystrophy of nail with pain -Nails palliatively debrided as below. -Educated on self-care - She can follow up in office for further routine nail care as needed in 2-3 months  Procedure: Nail Debridement Rationale: Pain Type of Debridement: manual, sharp debridement. Instrumentation: Nail nipper  Number of Nails: 8         Marolyn JULIANNA Honour, DPM [facility]

## 2024-02-09 NOTE — Plan of Care (Signed)

## 2024-02-10 DIAGNOSIS — S72001A Fracture of unspecified part of neck of right femur, initial encounter for closed fracture: Secondary | ICD-10-CM | POA: Diagnosis not present

## 2024-02-10 MED ORDER — FE FUM-VIT C-VIT B12-FA 460-60-0.01-1 MG PO CAPS: 1.0000 | ORAL_CAPSULE | Freq: Two times a day (BID) | ORAL | Status: AC

## 2024-02-10 MED ORDER — ASPIRIN 81 MG PO TBEC: 81.0000 mg | DELAYED_RELEASE_TABLET | Freq: Two times a day (BID) | ORAL | Status: AC

## 2024-02-10 NOTE — TOC Transition Note (Signed)
 Transition of Care [facility]) - Discharge Note   Patient Details  Name: Catherine Scott MRN: [id number] Date of Birth: 1940-12-02  Transition [facility] Care [facility]) CM/SW Contact:  Alvaro Louder, LCSW Phone Number: 02/10/2024, 1:01 PM   Clinical Narrative:   LCSWA received insurance approval for patient to admit to SNF [facility]. LCSWA confirmed with MD that patient is stable for discharge. LCSWA notified the patient and they are in agreement with discharge. LCSWA confirmed bed is available at SNF. Transport arranged with [facility] for next available.  Number to call report, [phone] RM: [phone]   TOC Signing off  Final next level of care: Skilled Nursing Facility Barriers to Discharge: No Barriers Identified   Patient Goals and CMS Choice            Discharge Placement              Patient chooses bed at: [facility] [place] Patient to be transferred to facility by: [facility] Name of family member notified: [name] Patient and family notified of of transfer: 02/10/24  Discharge Plan and Services Additional resources added to the After Visit Summary for                                       Social Drivers of Health (SDOH) Interventions SDOH Screenings   Food Insecurity: No Food Insecurity (02/05/2024)  Housing: Low Risk  (02/05/2024)  Transportation Needs: Unmet Transportation Needs (02/05/2024)  Utilities: Not At Risk (02/05/2024)  Depression (PHQ2-9): Medium Risk (09/16/2023)  Social Connections: Socially Isolated (02/05/2024)  Tobacco Use: High Risk (02/05/2024)     Readmission Risk Interventions     No data to display

## 2024-02-10 NOTE — Progress Notes (Signed)
 Subjective: 5 Days Post-Op Procedure(s) (LRB): FIXATION, FRACTURE, INTERTROCHANTERIC, WITH INTRAMEDULLARY ROD (Right) Patient is doing well and is stable.  She is making slow progress with PT. Plan is for her to go to skilled nursing rehab today. She will remain 50% weightbearing on the right leg. Dressing was changed and the wound is benign  Patient reports pain as mild.  Objective:   VITALS:   Vitals:   02/09/24 2004 02/10/24 0809  BP: (!) 108/59 (!) 147/73  Pulse: 75 78  Resp: 16 18  Temp: 98.5 F (36.9 C) 98.5 F (36.9 C)  SpO2: 96% 100%    Neurologically intact Incision: dressing C/D/I  LABS Recent Labs    02/08/24 0413  HGB 9.0*  HCT 28.5*  WBC 7.0  PLT 198    Recent Labs    02/08/24 0413  NA 140  K 4.7  BUN 26*  CREATININE 0.53  GLUCOSE 97    No results for input(s): LABPT, INR in the last 72 hours.   Assessment/Plan: 5 Days Post-Op Procedure(s) (LRB): FIXATION, FRACTURE, INTERTROCHANTERIC, WITH INTRAMEDULLARY ROD (Right)   Up with therapy Discharge to SNF Return to my office in 2 weeks 81 mg ASA twice daily for DVT prevention 50% weightbearing right leg with walker

## 2024-02-10 NOTE — Discharge Summary (Addendum)
 Physician Discharge Summary   Patient: Catherine Scott MRN: [id number] DOB: Sep 27, 1940  Admit date:     02/05/2024  Discharge date: 02/10/24  Discharge Physician: AIDA CHO   PCP: Manya Toribio SHAUNNA, PA   Recommendations at discharge:   Follow-up with Dr. Cleotilde, orthopedic surgeon, in 2 weeks  Discharge Diagnoses: Principal Problem:   Hip fracture [facility]) Active Problems:   Lung nodules   History of COPD   Unspecified protein-calorie malnutrition   Depression   Acute postoperative anemia due to expected blood loss   Malnutrition of moderate degree   Nail dystrophy  Resolved Problems:   * No resolved hospital problems. [facility] Course:  Ms. Catherine Scott is an 83 year old woman with medical history significant for COPD, colon cancer s/p colectomy end colostomy, depression, who presented to the hospital with mechanical fall and right hip pain.  She was found to have right hip fracture.  She underwent fixation of right intertrochanteric fracture with intramedullary rod on 02/05/2024.  She developed postoperative blood loss anemia but was not severe enough to require blood transfusion.  She was evaluated by PT and OT who recommended further rehabilitation at [facility].  Her condition has improved and she is deemed stable for discharge to SNF today.   Assessment and Plan:  Right hip fracture: S/p fixation of right intertrochanteric fracture with intramedullary rod on 02/05/2024.  Analgesics as needed for pain.  Follow-up with Dr. Cleotilde, orthopedic surgeon, in 2 weeks. Dr. Cleotilde recommended low-dose aspirin 81 mg twice daily for 6 weeks for DVT prophylaxis.   Acute postoperative blood loss anemia: H&H stable.  Hemoglobin down from 12.9-9.0.  No indication for blood transfusion.  Continue iron supplement.   COPD: Stable.  Continue bronchodilators.   Bilateral lung nodules appear to be chronic: Outpatient follow-up with PCP or pulmonologist recommended.    Discharge  plan discussed with [place], daughter, at the bedside.         Pain control - [place]  Controlled Substance Reporting System database was reviewed. and patient was instructed, not to drive, operate heavy machinery, perform activities at heights, swimming or participation in water activities or provide baby-sitting services while on Pain, Sleep and Anxiety Medications; until their outpatient Physician has advised to do so again. Also recommended to not to take more than prescribed Pain, Sleep and Anxiety Medications.  Consultants: Orthopedic surgeon Procedures performed: Right hip surgery Disposition: Skilled nursing facility Diet recommendation:  Cardiac diet DISCHARGE MEDICATION: Allergies as of 02/10/2024   No Known Allergies      Medication List     TAKE these medications    acetaminophen 325 MG tablet Commonly known as: TYLENOL Take 650 mg by mouth every 6 (six) hours as needed for mild pain (pain score 1-3).   aspirin EC 81 MG tablet Take 1 tablet (81 mg total) by mouth 2 (two) times daily. Swallow whole.   Atrovent  HFA 17 MCG/ACT inhaler Generic drug: ipratropium Inhale 2 puffs into the lungs every 6 (six) hours as needed.   escitalopram  10 MG tablet Commonly known as: LEXAPRO  TAKE 1 TABLET BY MOUTH EVERY DAY   Fe Fum-Vit C-Vit B12-FA Caps capsule Commonly known as: TRIGELS-F FORTE Take 1 capsule by mouth 2 (two) times daily.   gabapentin  300 MG capsule Commonly known as: NEURONTIN  TAKE 1 CAPSULE BY MOUTH THREE TIMES A DAY   Symbicort  160-4.5 MCG/ACT inhaler Generic drug: budesonide-formoterol Inhale 2 puffs into the lungs 2 (two) times daily.  Discharge Care Instructions  (From admission, onward)           Start     Ordered   02/10/24 0000  Discharge wound care:       Comments: Follow-up with orthopedic surgeon in 2 weeks   02/10/24 1000            Contact information for after-discharge care     Destination      [facility] [place], [place]. [place]   Service: Skilled Nursing Contact information: [address] [place]  [address] [phone]                    Discharge Exam: Fredricka Weights   02/05/24 0841  Weight: 52 kg   GEN: NAD SKIN: Warm and dry EYES: No pallor or icterus ENT: MMM CV: RRR PULM: CTA B ABD: soft, ND, NT, +BS CNS: AAO x 3, non focal EXT: R hip surgical wound is clean, dry an  intact with staples in place. Mild right hip tenderness   Condition at discharge: good  The results of significant diagnostics from this hospitalization (including imaging, microbiology, ancillary and laboratory) are listed below for reference.   Imaging Studies: DG HIP UNILAT WITH PELVIS 2-3 VIEWS RIGHT Result Date: 02/05/2024 CLINICAL DATA:  Elective surgery. EXAM: DG HIP (WITH OR WITHOUT PELVIS) 2-3V RIGHT COMPARISON:  Radiograph earlier today FINDINGS: Seven fluoroscopic spot views of the right hip submitted from the operating room. Femoral intramedullary nail with trans trochanteric and distal locking screw fixation traverse proximal femur fracture. Fluoroscopy time 40 seconds. Dose 7.08 mGy. IMPRESSION: Intraoperative fluoroscopy during right proximal femur fracture ORIF. Electronically Signed   By: Andrea Gasman M.D.   On: 02/05/2024 15:46   DG C-Arm 1-60 Min-No Report Result Date: 02/05/2024 Fluoroscopy was utilized by the requesting physician.  No radiographic interpretation.   ECHOCARDIOGRAM COMPLETE Result Date: 02/05/2024    ECHOCARDIOGRAM REPORT   Patient Name:   Catherine Scott Date of Exam: 02/05/2024 Medical Rec #:  [id number]         Height:       61.0 in Accession #:    [id number]        Weight:       114.6 lb Date of Birth:  Sep 05, 1940         BSA:          1.491 m Patient Age:    83 years          BP:           170/99 mmHg Patient Gender: F                 HR:           92 bpm. Exam Location:  [facility] Procedure: 2D Echo, Cardiac Doppler and Color Doppler  (Both Spectral and Color            Flow Doppler were utilized during procedure). Indications:    Pre-operative cardiovascular examination Z01.810  History:        Patient has no prior history of Echocardiogram examinations.                 COPD.  Sonographer:    Christopher Furnace Referring Phys: [id number] PING T ZHANG Diagnosing      Redell Cave MD Phys:  Sonographer Comments: Image acquisition challenging due to patient body habitus. IMPRESSIONS  1. Left ventricular ejection fraction, by estimation, is 60 to 65%. The left ventricle has normal function.  The left ventricle has no regional wall motion abnormalities. There is mild left ventricular hypertrophy. Left ventricular diastolic parameters are consistent with Grade I diastolic dysfunction (impaired relaxation).  2. Right ventricular systolic function is normal. The right ventricular size is normal.  3. The mitral valve is normal in structure. No evidence of mitral valve regurgitation.  4. The aortic valve is tricuspid. Aortic valve regurgitation is not visualized. Aortic valve sclerosis is present, with no evidence of aortic valve stenosis. FINDINGS  Left Ventricle: Left ventricular ejection fraction, by estimation, is 60 to 65%. The left ventricle has normal function. The left ventricle has no regional wall motion abnormalities. The left ventricular internal cavity size was normal in size. There is  mild left ventricular hypertrophy. Left ventricular diastolic parameters are consistent with Grade I diastolic dysfunction (impaired relaxation). Right Ventricle: The right ventricular size is normal. No increase in right ventricular wall thickness. Right ventricular systolic function is normal. Left Atrium: Left atrial size was normal in size. Right Atrium: Right atrial size was normal in size. Pericardium: There is no evidence of pericardial effusion. Mitral Valve: The mitral valve is normal in structure. No evidence of mitral valve regurgitation. MV peak gradient,  7.7 mmHg. The mean mitral valve gradient is 3.0 mmHg. Tricuspid Valve: The tricuspid valve is normal in structure. Tricuspid valve regurgitation is not demonstrated. Aortic Valve: The aortic valve is tricuspid. Aortic valve regurgitation is not visualized. Aortic valve sclerosis is present, with no evidence of aortic valve stenosis. Aortic valve mean gradient measures 3.0 mmHg. Aortic valve peak gradient measures 6.0  mmHg. Aortic valve area, by VTI measures 3.89 cm. Pulmonic Valve: The pulmonic valve was normal in structure. Pulmonic valve regurgitation is mild. Aorta: The aortic root is normal in size and structure. IAS/Shunts: No atrial level shunt detected by color flow Doppler.  LEFT VENTRICLE PLAX 2D LVIDd:         3.20 cm   Diastology LVIDs:         2.25 cm   LV e' medial:    5.11 cm/s LV PW:         1.00 cm   LV E/e' medial:  16.2 LV IVS:        1.30 cm   LV e' lateral:   5.55 cm/s LVOT diam:     2.00 cm   LV E/e' lateral: 14.9 LV SV:         82 LV SV Index:   55 LVOT Area:     3.14 cm  RIGHT VENTRICLE RV Basal diam:  2.80 cm RV Mid diam:    2.50 cm RV S prime:     18.80 cm/s TAPSE (M-mode): 2.4 cm LEFT ATRIUM           Index        RIGHT ATRIUM           Index LA diam:      1.80 cm 1.21 cm/m   RA Area:     12.60 cm LA Vol (A2C): 15.4 ml 10.33 ml/m  RA Volume:   28.30 ml  18.98 ml/m LA Vol (A4C): 22.0 ml 14.76 ml/m  AORTIC VALVE AV Area (Vmax):    3.27 cm AV Area (Vmean):   2.83 cm AV Area (VTI):     3.89 cm AV Vmax:           122.00 cm/s AV Vmean:          84.600 cm/s AV VTI:  0.210 m AV Peak Grad:      6.0 mmHg AV Mean Grad:      3.0 mmHg LVOT Vmax:         127.00 cm/s LVOT Vmean:        76.300 cm/s LVOT VTI:          0.260 m LVOT/AV VTI ratio: 1.24  AORTA Ao Root diam: 3.30 cm MITRAL VALVE                TRICUSPID VALVE MV Area (PHT): 3.28 cm     TR Peak grad:   11.2 mmHg MV Area VTI:   3.13 cm     TR Vmax:        167.00 cm/s MV Peak grad:  7.7 mmHg MV Mean grad:  3.0 mmHg     SHUNTS  MV Vmax:       1.39 m/s     Systemic VTI:  0.26 m MV Vmean:      83.0 cm/s    Systemic Diam: 2.00 cm MV Decel Time: 231 msec MV E velocity: 82.80 cm/s MV A velocity: 123.00 cm/s MV E/A ratio:  0.67 Redell Cave MD Electronically signed by Redell Cave MD Signature Date/Time: 02/05/2024/2:35:46 PM    Final    DG Chest Port 1 View Result Date: 02/05/2024 EXAM: 1 VIEW XRAY OF THE CHEST 02/05/2024 09:26:00 AM COMPARISON: 03/07/219 CLINICAL HISTORY: 809823 Fall 190176. Fall 809823. FINDINGS: LUNGS AND PLEURA: Hyperinflation of the lungs is noted. Biapical scarring is noted. Increased nodular density is noted in left lung apex concerning for possible nodule or mass. Possible minimal bilateral pleural effusions. No pulmonary edema. No pneumothorax. HEART AND MEDIASTINUM: No acute abnormality of the cardiac and mediastinal silhouettes. BONES AND SOFT TISSUES: No acute osseous abnormality. IMPRESSION: 1. Increased nodular density in the left lung apex suspicious for a pulmonary nodule or mass. CT scan of chest is recommended for further evaluation . 2. Trace bilateral pleural effusions. 3. Biapical scarring. 4. Pulmonary hyperinflation. Electronically signed by: Lynwood Seip MD 02/05/2024 09:44 AM EDT RP Workstation: [id number]   DG Hip Unilat W or Wo Pelvis 2-3 Views Right Result Date: 02/05/2024 EXAM: 2 or 3 VIEW(S) XRAY OF THE RIGHT HIP 02/05/2024 09:26:00 AM COMPARISON: None available. CLINICAL HISTORY: Fall. Pt to ED via [facility] from home for mechanical fall and R hip pain. pt fell last night at 9pm. Denies hitting head, no LOC, denies blood thinners. Pt was able to crawl to couch to call caregiver for assistance. FINDINGS: BONES AND JOINTS: Severely displaced and comminuted intratrochanteric fracture of proximal right femur is noted. No significant degenerative changes. SOFT TISSUES: The soft tissues are unremarkable. IMPRESSION: 1. Severely displaced comminuted intratrochanteric fracture of the proximal  right femur. Electronically signed by: Lynwood Seip MD 02/05/2024 09:41 AM EDT RP Workstation: [id number]   CT Cervical Spine Wo Contrast Result Date: 02/05/2024 EXAM: CT CERVICAL SPINE WITHOUT CONTRAST 02/05/2024 09:07:07 AM TECHNIQUE: CT of the cervical spine was performed without the administration of intravenous contrast. Multiplanar reformatted images are provided for review. Automated exposure control, iterative reconstruction, and/or weight based adjustment of the mA/kV was utilized to reduce the radiation dose to as low as reasonably achievable. COMPARISON: None available. CLINICAL HISTORY: Neck trauma (Age >= 65y). mechanical fall and R hip pain. FINDINGS: CERVICAL SPINE: BONES AND ALIGNMENT: No acute fracture or traumatic malalignment. There is a mild levocurvature of the cervical spine. There is slight degenerative anterolisthesis at C4-C5. DEGENERATIVE CHANGES: There is moderate chronic degenerative disc  disease at C5-C6 and C6-C7, with posterior endplate spurring resulting in moderate central spinal canal stenosis at both levels as well as mild-to-moderate bilateral neuroforaminal stenosis. There is moderate diffuse facet arthrosis present. SOFT TISSUES: No prevertebral soft tissue swelling. LUNGS: There is biapical pleural parenchymal scarring. IMPRESSION: 1. No acute abnormality of the cervical spine related to the reported neck trauma. 2. 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 Electronically signed by: Evalene Coho MD 02/05/2024 09:33 AM EDT RP Workstation: [id number]   CT Head Wo Contrast Result Date: 02/05/2024 EXAM: CT HEAD WITHOUT CONTRAST 02/05/2024 09:07:07 AM TECHNIQUE: CT of the head was performed without the administration of intravenous contrast. Automated exposure control, iterative reconstruction, and/or weight based adjustment of the mA/kV was utilized to  reduce the radiation dose to as low as reasonably achievable. COMPARISON: None available. CLINICAL HISTORY: Head trauma, minor (Age >= 65y). mechanical fall and R hip pain. FINDINGS: BRAIN AND VENTRICLES: No acute hemorrhage. No evidence of acute infarct. 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 There is chronic lacunar infarct within the right caudate nucleus. Vascular calcifications. No hydrocephalus. No extra-axial collection. No mass effect or midline shift. ORBITS: Bilateral lens replacement. No acute abnormality. SINUSES: No acute abnormality. SOFT TISSUES AND SKULL: No acute soft tissue abnormality. No skull fracture. IMPRESSION: 1. No acute intracranial abnormality related to the head trauma. 2. 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 3. 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 Electronically signed by: Evalene Coho MD 02/05/2024 09:14 AM EDT RP Workstation: [id number]    Microbiology: No results found for this or any previous visit.  Labs: CBC: Recent Labs  Lab 02/05/24 0842 02/06/24 0416 02/07/24 0413 02/08/24 0413  WBC 12.6* 9.5 7.8 7.0  NEUTROABS 11.4*  --   --   --   HGB 12.9 10.1* 9.6* 9.0*  HCT 38.8 31.5* 30.0* 28.5*  MCV 93.0 94.3 95.8 97.6  PLT 241 217 186 198   Basic Metabolic Panel: Recent Labs  Lab 02/05/24 0842 02/06/24 0416 02/07/24 0413 02/08/24 0413  NA 134* 134* 138 140  K 4.1 4.7 4.3 4.7  CL 94* 101 101 103  CO2 23 27 28 29   GLUCOSE 147* 128* 100* 97  BUN 20 20 27* 26*  CREATININE 0.49 0.56 0.54 0.53  CALCIUM 9.2 8.2* 8.5* 8.3*   Liver Function Tests: Recent Labs  Lab 02/05/24 0842  AST 38  ALT 22  ALKPHOS 132*  BILITOT 1.2  PROT 7.8  ALBUMIN 3.9   CBG: No results for input(s): GLUCAP in the last 168 hours.  Discharge time spent: greater than  30 minutes.  Signed: AIDA CHO, MD [facility] 02/10/2024

## 2024-02-10 NOTE — Progress Notes (Signed)
 Occupational Therapy Treatment Patient Details Name: KEENA DINSE MRN: [id number] DOB: 09-09-1940 Today's Date: 02/10/2024   History of present illness Pt admitted to [facility] on 02/05/24 for mechanical fall that resulted in R hip fracture. Now s/p R hip IM nailing on 10/17 by Dr. Kayla. Significant PMH includes: COPD, aortic atherosclerosis, hx colon CA s/p colostomy, MDD.   OT comments  Upon entering the room, pt seated in recliner chair and reports gown is wet from purewick malfunction. Pt needing set up A to obtain needed items to change gown and OT assisted pt with burping colostomy bag. OT changed soiled bed linens. Pt stands from low recliner chair with mod A and use of RW. Max verbal cues for [facility] precautions and use of UEs for step pivot transfer from recliner chair to bed. Sit >supine with mod A for B LEs and trunk support. NT assisting pt with changing of purewick once returning to bed. All needs within reach and pt making progress towards goals at this time.       If plan is discharge home, recommend the following:  A lot of help with bathing/dressing/bathroom;A lot of help with walking and/or transfers;A little help with walking and/or transfers;Assistance with cooking/housework;Help with stairs or ramp for entrance   Equipment Recommendations  Other (comment) (defer to next venue of care)       Precautions / Restrictions Precautions Precautions: Fall Recall of Precautions/Restrictions: Intact Restrictions Weight Bearing Restrictions Per Provider Order: Yes RLE Weight Bearing Per Provider Order: Partial weight bearing RLE Partial Weight Bearing Percentage or Pounds: 50       Mobility Bed Mobility Overal bed mobility: Needs Assistance Bed Mobility: Sit to Supine       Sit to supine: Mod assist   General bed mobility comments: B LEs    Transfers Overall transfer level: Needs assistance Equipment used: Rolling walker (2 wheels) Transfers: Sit to/from Stand, Bed  to chair/wheelchair/BSC Sit to Stand: Mod assist     Step pivot transfers: Mod assist           Balance Overall balance assessment: Needs assistance Sitting-balance support: Feet supported Sitting balance-Leahy Scale: Good     Standing balance support: Bilateral upper extremity supported, During functional activity, Reliant on assistive device for balance Standing balance-Leahy Scale: Poor                             ADL either performed or assessed with clinical judgement   ADL Overall ADL's : Needs assistance/impaired                 Upper Body Dressing : Sitting;Set up Upper Body Dressing Details (indicate cue type and reason): seated in recliner chair to don hospital gown                        Extremity/Trunk Assessment Upper Extremity Assessment Upper Extremity Assessment: Overall WFL for tasks assessed            Vision Patient Visual Report: No change from baseline           Communication Communication Communication: Impaired Factors Affecting Communication: Hearing impaired   Cognition Arousal: Alert Behavior During Therapy: WFL for tasks assessed/performed Cognition: No apparent impairments                               Following commands: Intact  Cueing   Cueing Techniques: Verbal cues, Tactile cues        General Comments reviewed PWB restrictions and need to contiue to perform previously issued hEP to progress strength and abilities    Pertinent Vitals/ Pain       Pain Assessment Pain Assessment: No/denies pain         Frequency  Min 2X/week        Progress Toward Goals  OT Goals(current goals can now be found in the care plan section)  Progress towards OT goals: Progressing toward goals         Co-evaluation        PT goals addressed during session: Mobility/safety with mobility;Balance;Proper use of DME;Strengthening/ROM        AM-PAC OT 6 Clicks Daily Activity      Outcome Measure   Help from another person eating meals?: None Help from another person taking care of personal grooming?: A Little Help from another person toileting, which includes using toliet, bedpan, or urinal?: A Lot Help from another person bathing (including washing, rinsing, drying)?: A Lot Help from another person to put on and taking off regular upper body clothing?: A Little Help from another person to put on and taking off regular lower body clothing?: A Lot 6 Click Score: 16    End of Session Equipment Utilized During Treatment: Gait belt;Rolling walker (2 wheels)  OT Visit Diagnosis: Other abnormalities of gait and mobility (R26.89);Muscle weakness (generalized) (M62.81)   Activity Tolerance Patient tolerated treatment well;Treatment limited secondary to medical complications (Comment)   Patient Left in bed;with call bell/phone within reach;with bed alarm set;with nursing/sitter in room   Nurse Communication Mobility status        Time: 1049-1110 OT Time Calculation (min): 21 min  Charges: OT General Charges $OT Visit: 1 Visit OT Treatments $Therapeutic Activity: 8-22 mins  Izetta Claude, MS, OTR/L , CBIS ascom [phone]  02/10/24, 12:24 PM

## 2024-02-10 NOTE — Plan of Care (Signed)

## 2024-02-10 NOTE — Plan of Care (Signed)
  Problem: Education: Goal: Knowledge of General Education information will improve Description: Including pain rating scale, medication(s)/side effects and non-pharmacologic comfort measures Outcome: Progressing   Problem: Clinical Measurements: Goal: Respiratory complications will improve Outcome: Progressing   Problem: Activity: Goal: Risk for activity intolerance will decrease Outcome: Progressing   Problem: Nutrition: Goal: Adequate nutrition will be maintained Outcome: Progressing   Problem: Pain Managment: Goal: General experience of comfort will improve and/or be controlled Outcome: Progressing

## 2024-02-10 NOTE — Progress Notes (Signed)
 [facility] was called and report was given to nurse Leontine. Patient is waiting on transport.

## 2024-02-10 NOTE — Progress Notes (Signed)
 Physical Therapy Treatment Patient Details Name: Catherine Scott MRN: [id number] DOB: 04/23/1940 Today's Date: 02/10/2024   History of Present Illness Pt admitted to [facility] on 02/[facility] for mechanical fall that resulted in R hip fracture. Now s/p R hip IM nailing on 10/17 by Dr. Kayla. Significant PMH includes: COPD, aortic atherosclerosis, hx colon CA s/p colostomy, MDD.    PT Comments  Pt was long sitting in bed upon arrival. She remains A and O but HOH. Was premedicated for pain however endorses 4/10 pain. She continues to require extensive assistance to safely exit bed, stand to RW, and tolerate several antalgic step to steps form EOB to recliner. Pt is aware of 50% PWB. Constant vcs for increased UE to un weight LE to take steps.  Dc recs remain appropriate to maximize her independence and safety with all ADLs. Pt was seated in recliner with chair alarm in place, call bell in reach, and breakfast tray setup in front of her.    If plan is discharge home, recommend the following: A lot of help with walking and/or transfers;A lot of help with bathing/dressing/bathroom;Assistance with cooking/housework;Assist for transportation;Help with stairs or ramp for entrance     Equipment Recommendations  Other (comment) (Defer to next level of care)       Precautions / Restrictions Precautions Precautions: Fall Recall of Precautions/Restrictions: Intact Restrictions Weight Bearing Restrictions Per Provider Order: Yes RLE Weight Bearing Per Provider Order: Partial weight bearing RLE Partial Weight Bearing Percentage or Pounds: 50     Mobility  Bed Mobility Overal bed mobility: Needs Assistance Bed Mobility: Supine to Sit  Supine to sit: Mod assist, HOB elevated, Used rails  General bed mobility comments: increased time + assistance to safely achieve EOB sitting    Transfers Overall transfer level: Needs assistance Equipment used: Rolling walker (2 wheels) Transfers: Sit to/from Stand Sit  to Stand: Mod assist, From elevated surface  General transfer comment: Pt stood 2 x EOB prior to taking a few very antalgic steps form EOB to recliner    Ambulation/Gait Ambulation/Gait assistance: Mod assist Gait Distance (Feet): 3 Feet Assistive device: Rolling walker (2 wheels) Gait Pattern/deviations: Step-to pattern, Antalgic Gait velocity: decreased  General Gait Details: Pt requires mod assist to safely take ~ 3 steps form EOB to recliner. constant vcs for increased UE support to maintain 50 % PWB    Balance Overall balance assessment: Needs assistance Sitting-balance support: Feet supported Sitting balance-Leahy Scale: Good     Standing balance support: Bilateral upper extremity supported, During functional activity, Reliant on assistive device for balance Standing balance-Leahy Scale: Poor Standing balance comment: pt remains at high risk of falls       Communication Communication Communication: Impaired Factors Affecting Communication: Hearing impaired  Cognition Arousal: Alert Behavior During Therapy: WFL for tasks assessed/performed   PT - Cognitive impairments: No apparent impairments      PT - Cognition Comments: pt is A and O x 3 Following commands: Intact      Cueing Cueing Techniques: Verbal cues, Tactile cues     General Comments General comments (skin integrity, edema, etc.): reviewed PWB restrictions and need to contiue to perform previously issued hEP to progress strength and abilities      Pertinent Vitals/Pain Pain Assessment Pain Assessment: 0-10 Pain Score: 4  Pain Location: R hip Pain Descriptors / Indicators: Sore Pain Intervention(s): Limited activity within patient's tolerance, Monitored during session, Premedicated before session, Repositioned     PT Goals (current goals can now be  found in the care plan section) Acute Rehab PT Goals Patient Stated Goal: get better so I can go home Progress towards PT goals: Progressing toward  goals    Frequency    Min 2X/week       Co-evaluation     PT goals addressed during session: Mobility/safety with mobility;Balance;Proper use of DME;Strengthening/ROM        AM-PAC PT 6 Clicks Mobility   Outcome Measure  Help needed turning from your back to your side while in a flat bed without using bedrails?: A Little Help needed moving from lying on your back to sitting on the side of a flat bed without using bedrails?: A Lot Help needed moving to and from a bed to a chair (including a wheelchair)?: A Lot Help needed standing up from a chair using your arms (e.g., wheelchair or bedside chair)?: A Lot Help needed to walk in hospital room?: A Lot Help needed climbing 3-5 steps with a railing? : A Lot 6 Click Score: 13    End of Session   Activity Tolerance: Patient tolerated treatment well Patient left: in chair;with call bell/phone within reach;with chair alarm set Nurse Communication: Mobility status PT Visit Diagnosis: Unsteadiness on feet (R26.81);Muscle weakness (generalized) (M62.81);Difficulty in walking, not elsewhere classified (R26.2)     Time: 9147-9090 PT Time Calculation (min) (ACUTE ONLY): 17 min  Charges:    $Therapeutic Activity: 8-22 mins PT General Charges $$ ACUTE PT VISIT: 1 Visit                     Rankin Essex PTA 02/10/24, 9:22 AM

## 2024-02-10 NOTE — Progress Notes (Signed)
 EMS transport to [facility].

## 2024-02-12 ENCOUNTER — Inpatient Hospital Stay: Admitting: Physician Assistant

## 2024-03-02 ENCOUNTER — Telehealth: Payer: Self-pay

## 2024-03-02 NOTE — Transitions of Care (Post Inpatient/ED Visit) (Signed)
   03/02/2024  Name: Catherine Scott MRN: [id number] DOB: 30-Oct-1940  Today's TOC FU Call Status: Today's TOC FU Call Status:: Unsuccessful Call (1st Attempt) Unsuccessful Call (1st Attempt) Date: 03/02/24  Attempted to reach the patient regarding the most recent Inpatient/ED visit.  Follow Up Plan: Additional outreach attempts will be made to reach the patient to complete the Transitions of Care (Post Inpatient/ED visit) call.   Signature Julian Lemmings, LPN [facility] Nurse Health Advisor Direct Dial [phone]

## 2024-03-03 NOTE — Transitions of Care (Post Inpatient/ED Visit) (Signed)
03/03/2024  Name: Catherine Scott MRN: [id number] DOB: 05-11-1940  Today's TOC FU Call Status: Today's TOC FU Call Status:: Successful TOC FU Call Completed Unsuccessful Call (1st Attempt) Date: 03/02/24 [facility] FU Call Complete Date: 03/03/24  Patient's Name and Date of Birth confirmed. DOB  Transition Care Management Follow-up Telephone Call Date of Discharge: 03/01/24 Discharge Facility: Other ([facility]) Name of Other ([facility]) Discharge Facility: [facility] Type of Discharge: Inpatient Admission Primary Inpatient Discharge Diagnosis:: fracture right femur How have you been since you were released from the hospital?: Better Any questions or concerns?: No  Items Reviewed: Did you receive and understand the discharge instructions provided?: Yes Medications obtained,verified, and reconciled?: Yes (Medications Reviewed) Any new allergies since your discharge?: No Dietary orders reviewed?: Yes Do you have support at home?: No  Medications Reviewed Today: Medications Reviewed Today     Reviewed by Emmitt Pan, LPN (Licensed Practical Nurse) on 03/03/24 at 1132  Med List Status: <None>   Medication Order Taking? Sig Documenting Provider Last Dose Status Informant  acetaminophen (TYLENOL) 325 MG tablet [id number] Yes Take 650 mg by mouth every 6 (six) hours as needed for mild pain (pain score 1-3). [provider]  Active Child, Pharmacy Records  aspirin EC 81 MG tablet [id number] Yes Take 1 tablet (81 mg total) by mouth 2 (two) times daily. Swallow whole. Jens Durand, MD  Active   ATROVENT  [facility] 17 MCG/ACT inhaler [id number] Yes Inhale 2 puffs into the lungs every 6 (six) hours as needed. Manya Toribio SHAUNNA, PA  Active Child, Pharmacy Records           Med Note NIKKI, HADASSAH   Fri Feb 05, 2024 10:51 AM) prn  escitalopram  (LEXAPRO ) 10 MG tablet [id number] Yes TAKE 1 TABLET BY MOUTH EVERY DAY Manya Toribio SHAUNNA, PA  Active Child, Pharmacy Records  Fe Fum-Vit C-Vit  B12-FA (TRIGELS-F FORTE) CAPS capsule [id number] Yes Take 1 capsule by mouth 2 (two) times daily. Jens Durand, MD  Active   gabapentin  (NEURONTIN ) 300 MG capsule [id number] Yes TAKE 1 CAPSULE BY MOUTH THREE TIMES A DAY Manya Toribio SHAUNNA, PA  Active Child, Pharmacy Records  SYMBICORT  160-4.5 MCG/ACT inhaler [id number] Yes Inhale 2 puffs into the lungs 2 (two) times daily. Manya Toribio SHAUNNA, PA  Active Child, Pharmacy Records            Home Care and Equipment/Supplies: Were Home Health Services Ordered?: Yes Name of Home Health Agency:: unknown Has Agency set up a time to come to your home?: Yes First Home Health Visit Date: 03/07/24 Any new equipment or medical supplies ordered?: Yes Name of Medical supply agency?: unknown Were you able to get the equipment/medical supplies?: No Do you have any questions related to the use of the equipment/supplies?: No  Functional Questionnaire: Do you need assistance with bathing/showering or dressing?: No Do you need assistance with meal preparation?: No Do you need assistance with eating?: No Do you have difficulty maintaining continence: No Do you need assistance with getting out of bed/getting out of a chair/moving?: No Do you have difficulty managing or taking your medications?: No  Follow up appointments reviewed: PCP Follow-up appointment confirmed?: No (declined) MD Provider Line Number:[phone] Given: No Specialist Hospital Follow-up appointment confirmed?: Yes Date of Specialist follow-up appointment?: 03/24/24 Follow-Up Specialty Provider:: ortho Do you need transportation to your follow-up appointment?: No Do you understand care options if your condition(s) worsen?: Yes-patient verbalized understanding    SIGNATURE Pan Emmitt, LPN [facility] Nurse Health Advisor Direct Dial  [phone]  

## 2024-03-04 ENCOUNTER — Telehealth: Payer: Self-pay

## 2024-03-04 NOTE — Telephone Encounter (Signed)
 Called Hunter gave verbal. He verbalized understanding.  KP

## 2024-03-04 NOTE — Telephone Encounter (Signed)
 Copied from CRM #[id number]. Topic: Clinical - Home Health Verbal Orders >> Mar 04, 2024  2:43 PM Mercedes MATSU wrote: Caller/Agency: Katrinka MATSU [facility]) Callback Number: [phone] Service Requested: Physical Therapy Frequency: 1 week 9 Any new concerns about the patient? No

## 2024-03-11 ENCOUNTER — Telehealth: Payer: Self-pay

## 2024-03-11 NOTE — Telephone Encounter (Signed)
 Copied from CRM #[id number]. Topic: Clinical - Home Health Verbal Orders >> Mar 10, 2024  4:53 PM Delon DASEN wrote: Caller/Agency: Philippe with [facility] Callback Number: [phone] Service Requested: Speech Therapy Frequency: 1x wk for 4 wks Any new concerns about the patient? No

## 2024-03-11 NOTE — Telephone Encounter (Signed)
 Called Philippe gave her verbal. She verbalized understanding.  KP

## 2024-03-21 ENCOUNTER — Ambulatory Visit: Admitting: Physician Assistant

## 2024-03-24 ENCOUNTER — Other Ambulatory Visit: Payer: Self-pay | Admitting: Physician Assistant

## 2024-03-28 ENCOUNTER — Telehealth: Payer: Self-pay

## 2024-03-28 NOTE — Telephone Encounter (Signed)
 Requested Prescriptions  Pending Prescriptions Disp Refills   SYMBICORT  160-4.5 MCG/ACT inhaler [Pharmacy Med Name: SYMBICORT  160-4.5 MCG INHALER] 10.2 each 0    Sig: INHALE 2 PUFFS INTO THE LUNGS TWICE A DAY     Pulmonology:  Combination Products Passed - 03/28/2024  9:14 AM      Passed - Valid encounter within last 12 months    Recent Outpatient Visits           6 months ago Chronic obstructive pulmonary disease, unspecified COPD type [facility])   [facility] at [place], [place], [place]

## 2024-03-28 NOTE — Telephone Encounter (Signed)
 Copied from CRM [id number]. Topic: General - Other >> Mar 28, 2024 10:47 AM Hadassah PARAS wrote: Reason for CRM: Philippe from [facility] is requested a discharge order from speech therapy. Pt is declining services. Any questions please call Philippe on #[phone]

## 2024-03-31 ENCOUNTER — Telehealth: Payer: Self-pay

## 2024-03-31 NOTE — Telephone Encounter (Signed)
 Spoke with Tiffany, she just wanted to make me aware and needed verbal okay from time to try the patient again next week.

## 2024-03-31 NOTE — Telephone Encounter (Signed)
 Copied from CRM #[id number]. Topic: Clinical - Home Health Verbal Orders >> Mar 31, 2024 10:38 AM Terri MATSU wrote: Caller/Agency: Tiffany from [facility] Callback Number: [phone] Service Requested: Skilled Nursing Frequency: Been having a hard time reaching patient and they want try again to reach her next week. Any new concerns about the patient? Yes

## 2024-04-08 ENCOUNTER — Other Ambulatory Visit: Payer: Self-pay | Admitting: Physician Assistant

## 2024-04-12 NOTE — Telephone Encounter (Signed)
 Requested Prescriptions  Pending Prescriptions Disp Refills   escitalopram  (LEXAPRO ) 10 MG tablet [Pharmacy Med Name: ESCITALOPRAM  10 MG TABLET] 90 tablet 0    Sig: TAKE 1 TABLET BY MOUTH EVERY DAY     Psychiatry:  Antidepressants - SSRI Failed - 04/12/2024 10:36 AM      Failed - Valid encounter within last 6 months    Recent Outpatient Visits           6 months ago Chronic obstructive pulmonary disease, unspecified COPD type [facility])   [place] [facility] at [facility], [place], [place]              Passed - Completed PHQ-2 or PHQ-9 in the last 360 days

## 2024-05-02 ENCOUNTER — Telehealth: Payer: Self-pay

## 2024-05-02 NOTE — Telephone Encounter (Signed)
 Copied from CRM #[id number]. Topic: Clinical - Home Health Verbal Orders >> May 02, 2024  8:58 AM Lonell PEDLAR wrote: Caller/Agency: [facility] Callback Number: [phone] Service Requested: Physical Therapy Frequency: 3 times a week for 1 weeks 1 time a week for 6 weeks Any new concerns about the patient? No

## 2024-05-02 NOTE — Telephone Encounter (Signed)
 Called [facility] left VM giving verbal. Name was on VM.  KP

## 2024-05-09 ENCOUNTER — Ambulatory Visit: Admitting: Physician Assistant

## 2024-06-06 ENCOUNTER — Ambulatory Visit: Admitting: Physician Assistant
